# Patient Record
Sex: Female | Born: 1965 | Race: White | Hispanic: No | Marital: Married | Smoking: Former smoker
Health system: Southern US, Community
[De-identification: ages and names within clinical notes are randomized; demographics above are authoritative.]

## PROBLEM LIST (undated history)

## (undated) DIAGNOSIS — M5430 Sciatica, unspecified side: Secondary | ICD-10-CM

## (undated) DIAGNOSIS — K449 Diaphragmatic hernia without obstruction or gangrene: Secondary | ICD-10-CM

## (undated) DIAGNOSIS — E039 Hypothyroidism, unspecified: Secondary | ICD-10-CM

## (undated) DIAGNOSIS — K219 Gastro-esophageal reflux disease without esophagitis: Secondary | ICD-10-CM

## (undated) HISTORY — PX: CARPAL TUNNEL RELEASE: SHX101

---

## 1998-07-24 ENCOUNTER — Ambulatory Visit (HOSPITAL_COMMUNITY)
Admission: RE | Admit: 1998-07-24 | Discharge: 1998-07-24 | Payer: Self-pay | Admitting: Physical Medicine and Rehabilitation

## 1998-08-14 ENCOUNTER — Encounter: Payer: Self-pay | Admitting: Physical Medicine and Rehabilitation

## 1998-08-28 ENCOUNTER — Ambulatory Visit (HOSPITAL_COMMUNITY)
Admission: RE | Admit: 1998-08-28 | Discharge: 1998-08-28 | Payer: Self-pay | Admitting: Physical Medicine and Rehabilitation

## 1998-08-28 ENCOUNTER — Encounter: Payer: Self-pay | Admitting: Neurosurgery

## 2000-10-26 HISTORY — PX: TUBAL LIGATION: SHX77

## 2016-05-26 HISTORY — PX: LAPAROSCOPIC CHOLECYSTECTOMY: SUR755

## 2016-06-13 ENCOUNTER — Encounter (HOSPITAL_COMMUNITY)
Admission: AD | Disposition: A | Discharge status: Home / Self Care | Payer: Self-pay | Source: Other Acute Inpatient Hospital | Attending: Internal Medicine

## 2016-06-13 ENCOUNTER — Encounter (HOSPITAL_COMMUNITY): Payer: Self-pay | Admitting: Family Medicine

## 2016-06-13 ENCOUNTER — Inpatient Hospital Stay (HOSPITAL_COMMUNITY)
Admission: AD | Admit: 2016-06-13 | Discharge: 2016-06-17 | DRG: 394 | Disposition: A | Discharge status: Home / Self Care | Payer: BLUE CROSS/BLUE SHIELD | Source: Other Acute Inpatient Hospital | Attending: Internal Medicine | Admitting: Internal Medicine

## 2016-06-13 DIAGNOSIS — R109 Unspecified abdominal pain: Secondary | ICD-10-CM | POA: Diagnosis present

## 2016-06-13 DIAGNOSIS — R079 Chest pain, unspecified: Secondary | ICD-10-CM

## 2016-06-13 DIAGNOSIS — M5430 Sciatica, unspecified side: Secondary | ICD-10-CM | POA: Diagnosis present

## 2016-06-13 DIAGNOSIS — E860 Dehydration: Secondary | ICD-10-CM | POA: Diagnosis not present

## 2016-06-13 DIAGNOSIS — K219 Gastro-esophageal reflux disease without esophagitis: Secondary | ICD-10-CM | POA: Diagnosis present

## 2016-06-13 DIAGNOSIS — Z87891 Personal history of nicotine dependence: Secondary | ICD-10-CM

## 2016-06-13 DIAGNOSIS — E871 Hypo-osmolality and hyponatremia: Secondary | ICD-10-CM | POA: Diagnosis present

## 2016-06-13 DIAGNOSIS — K9189 Other postprocedural complications and disorders of digestive system: Secondary | ICD-10-CM | POA: Diagnosis present

## 2016-06-13 DIAGNOSIS — R197 Diarrhea, unspecified: Secondary | ICD-10-CM | POA: Diagnosis not present

## 2016-06-13 DIAGNOSIS — K929 Disease of digestive system, unspecified: Secondary | ICD-10-CM | POA: Diagnosis not present

## 2016-06-13 DIAGNOSIS — K449 Diaphragmatic hernia without obstruction or gangrene: Secondary | ICD-10-CM | POA: Diagnosis present

## 2016-06-13 DIAGNOSIS — Z791 Long term (current) use of non-steroidal anti-inflammatories (NSAID): Secondary | ICD-10-CM | POA: Diagnosis not present

## 2016-06-13 DIAGNOSIS — E039 Hypothyroidism, unspecified: Secondary | ICD-10-CM | POA: Diagnosis present

## 2016-06-13 DIAGNOSIS — Y836 Removal of other organ (partial) (total) as the cause of abnormal reaction of the patient, or of later complication, without mention of misadventure at the time of the procedure: Secondary | ICD-10-CM | POA: Diagnosis present

## 2016-06-13 DIAGNOSIS — D649 Anemia, unspecified: Secondary | ICD-10-CM | POA: Diagnosis present

## 2016-06-13 DIAGNOSIS — K1370 Unspecified lesions of oral mucosa: Secondary | ICD-10-CM | POA: Diagnosis not present

## 2016-06-13 DIAGNOSIS — Z6841 Body Mass Index (BMI) 40.0 and over, adult: Secondary | ICD-10-CM | POA: Diagnosis not present

## 2016-06-13 DIAGNOSIS — K838 Other specified diseases of biliary tract: Secondary | ICD-10-CM | POA: Diagnosis not present

## 2016-06-13 DIAGNOSIS — Z79899 Other long term (current) drug therapy: Secondary | ICD-10-CM | POA: Diagnosis not present

## 2016-06-13 HISTORY — DX: Gastro-esophageal reflux disease without esophagitis: K21.9

## 2016-06-13 HISTORY — DX: Diaphragmatic hernia without obstruction or gangrene: K44.9

## 2016-06-13 HISTORY — DX: Hypothyroidism, unspecified: E03.9

## 2016-06-13 HISTORY — DX: Sciatica, unspecified side: M54.30

## 2016-06-13 LAB — CBC WITH DIFFERENTIAL/PLATELET
BASOS ABS: 0 10*3/uL (ref 0.0–0.1)
Basophils Relative: 0 %
Eosinophils Absolute: 0.1 10*3/uL (ref 0.0–0.7)
Eosinophils Relative: 2 %
HEMATOCRIT: 35.5 % — AB (ref 36.0–46.0)
HEMOGLOBIN: 11.5 g/dL — AB (ref 12.0–15.0)
LYMPHS PCT: 8 %
Lymphs Abs: 0.6 10*3/uL — ABNORMAL LOW (ref 0.7–4.0)
MCH: 28.5 pg (ref 26.0–34.0)
MCHC: 32.4 g/dL (ref 30.0–36.0)
MCV: 88.1 fL (ref 78.0–100.0)
Monocytes Absolute: 0.8 10*3/uL (ref 0.1–1.0)
Monocytes Relative: 11 %
NEUTROS ABS: 6.1 10*3/uL (ref 1.7–7.7)
NEUTROS PCT: 79 %
Platelets: 210 10*3/uL (ref 150–400)
RBC: 4.03 MIL/uL (ref 3.87–5.11)
RDW: 13.9 % (ref 11.5–15.5)
WBC: 7.6 10*3/uL (ref 4.0–10.5)

## 2016-06-13 SURGERY — ERCP, WITH INTERVENTION IF INDICATED
Anesthesia: General

## 2016-06-13 MED ORDER — ENOXAPARIN SODIUM 40 MG/0.4ML ~~LOC~~ SOLN
40.0000 mg | Freq: Every day | SUBCUTANEOUS | Status: DC
  Administered 2016-06-15 – 2016-06-17 (×3): 40 mg via SUBCUTANEOUS
  Filled 2016-06-13 (×3): qty 0.4

## 2016-06-13 MED ORDER — PANTOPRAZOLE SODIUM 40 MG PO TBEC
40.0000 mg | DELAYED_RELEASE_TABLET | Freq: Every day | ORAL | Status: DC
Start: 2016-06-14 — End: 2016-06-17
  Administered 2016-06-14 – 2016-06-17 (×4): 40 mg via ORAL
  Filled 2016-06-13 (×4): qty 1

## 2016-06-13 MED ORDER — METHOCARBAMOL 750 MG PO TABS
750.0000 mg | ORAL_TABLET | Freq: Four times a day (QID) | ORAL | Status: DC
  Administered 2016-06-13 – 2016-06-17 (×13): 750 mg via ORAL
  Filled 2016-06-13 (×14): qty 1

## 2016-06-13 MED ORDER — ONDANSETRON HCL 4 MG/2ML IJ SOLN
4.0000 mg | Freq: Four times a day (QID) | INTRAMUSCULAR | Status: DC | PRN
  Administered 2016-06-14 (×2): 4 mg via INTRAVENOUS
  Filled 2016-06-13 (×2): qty 2

## 2016-06-13 MED ORDER — ACETAMINOPHEN 325 MG PO TABS
650.0000 mg | ORAL_TABLET | Freq: Four times a day (QID) | ORAL | Status: DC | PRN
  Administered 2016-06-16 (×2): 650 mg via ORAL
  Filled 2016-06-13 (×2): qty 2

## 2016-06-13 MED ORDER — ACETAMINOPHEN 650 MG RE SUPP: 650.0000 mg | Freq: Four times a day (QID) | RECTAL | Status: DC | PRN

## 2016-06-13 MED ORDER — SENNOSIDES-DOCUSATE SODIUM 8.6-50 MG PO TABS
2.0000 | ORAL_TABLET | Freq: Every day | ORAL | Status: DC
  Administered 2016-06-15: 2 via ORAL
  Filled 2016-06-13: qty 2

## 2016-06-13 MED ORDER — METHOCARBAMOL 500 MG PO TABS: 1000.0000 mg | ORAL_TABLET | Freq: Three times a day (TID) | ORAL | Status: DC | PRN

## 2016-06-13 MED ORDER — OXYCODONE-ACETAMINOPHEN 5-325 MG PO TABS: 1.0000 | ORAL_TABLET | ORAL | Status: DC | PRN

## 2016-06-13 MED ORDER — LEVOTHYROXINE SODIUM 25 MCG PO TABS
125.0000 ug | ORAL_TABLET | Freq: Every day | ORAL | Status: DC
  Administered 2016-06-14 – 2016-06-17 (×4): 125 ug via ORAL
  Filled 2016-06-13 (×4): qty 1

## 2016-06-13 MED ORDER — NAPROXEN SODIUM 550 MG PO TABS
550.0000 mg | ORAL_TABLET | Freq: Every day | ORAL | Status: DC
  Filled 2016-06-13: qty 1

## 2016-06-13 MED ORDER — OXYCODONE-ACETAMINOPHEN 5-325 MG PO TABS
1.5000 | ORAL_TABLET | ORAL | Status: DC | PRN
  Administered 2016-06-14 – 2016-06-15 (×3): 1.5 via ORAL
  Filled 2016-06-13 (×3): qty 2

## 2016-06-13 MED ORDER — ONDANSETRON HCL 4 MG PO TABS: 4.0000 mg | ORAL_TABLET | Freq: Four times a day (QID) | ORAL | Status: DC | PRN

## 2016-06-13 MED ORDER — SENNOSIDES-DOCUSATE SODIUM 8.6-50 MG PO TABS: 1.0000 | ORAL_TABLET | Freq: Every day | ORAL | Status: DC

## 2016-06-13 NOTE — H&P (Signed)
History and Physical  Patient Name: Kristin SievertJill E Farnell     WUJ:[id number]    DOB: 06/01/1966    DOA: 06/13/2016 PCP: Virl DiamondPALLONE, AMANDA W, MD   Patient coming from: Home --> [facility] transfer  Chief Complaint: Abdominal and chest pain  HPI: Kristin Doyle is a 50 y.o. female with a past medical history significant for obesity and hypothroidism who presents with chest pain.  The patient was in her usual state of health until a few weeks ago when she had onset of chest pain radiating to the shoulder blades and was admitted overnight at [facility] for cardiac rule out, had serial negative ezymes and EKG, as well as CT of the abdomen and pelvis with contrast, and HIDA scan all of which were normal (RUQ US had only positive Murphy's sign).  Her PCP was concerned in follow up that she might have worsening of her hiatal hernia and referred her to General Surgery.  Then 10 days ago she had her gallbladder removed [facility] on a Thursday, had improvement of her symtoms and was back to work on Monday.    However, Wednesday night of this week after work she had again onset of dull epigastric discomfort that developed into "10/10" pain all over the chest, shoulders and radiating to the right shoulder (she thought she was having a heart attack).  [facility], had serially negative enzymes, normal EKG.  A CT angiogram of the chest, abdomen and pelvis was obtained that showed no PE and only normal post-operative changes and General Surgery at [facility] doubted post-operative complications given her initial improvement after surgery.  This morning, she underwent a HIDA scan that showed ectopic tracer in the abdomen, interpreted as probable post-operative bile leak.  This was discussed with her cholecystectomy surgeon and then Dr. Elnoria HowardHung from [facility] who recommended [facility] and ERCP.     Review of Systems:  Review of Systems  Constitutional:  Negative for chills and fever.  Gastrointestinal: Positive for abdominal pain, constipation and nausea (with eating). Negative for diarrhea and vomiting.       Pleuritic pain  All other systems reviewed and are negative.    Past Medical History:  Diagnosis Date  . GERD (gastroesophageal reflux disease)   . Hiatal hernia   . Hypothyroidism   . Sciatica     Past Surgical History:  Procedure Laterality Date  . CARPAL TUNNEL RELEASE    . CHOLECYSTECTOMY  05/2016  . TUBAL LIGATION      Social History: Patient lives with her husband and two teenage children.  Patient walks unassisted.  She is a Engineer, siteschool teacher.  She does not drink or smoke.  [place]  Allergies  Allergen Reactions  . Ciprofloxacin Swelling    Family history: family history includes Bladder Cancer in her mother; Heart disease in her maternal grandmother, maternal uncle, and paternal grandmother.  Prior to Admission medications   Medication Sig Start Date End Date Taking? Authorizing Provider  levothyroxine (SYNTHROID, LEVOTHROID) 125 MCG tablet Take 125 mcg by mouth daily before breakfast.   Yes Historical Provider, MD  naproxen sodium (ANAPROX) 550 MG tablet Take 550 mg by mouth daily.   Yes Historical Provider, MD  omeprazole (PRILOSEC) 20 MG capsule Take 20 mg by mouth daily.   Yes Historical Provider, MD       Physical Exam: BP (!) 147/79 (BP Location: Left Arm)   Pulse 94  Temp 98.4 F (36.9 C) (Oral)   Resp 17   Ht 5\' 7"  (1.702 m)   Wt 119.3 kg (263 lb 0.1 oz)   SpO2 97%   BMI 41.19 kg/m  General appearance: Well-developed, obese adult female, alert and in no acute distress, appears mildly uncomfortable.   Eyes: Anicteric, conjunctiva pink, lids and lashes normal.     ENT: No nasal deformity, discharge, or epistaxis.  OP moist without lesions.   Skin: Warm and dry.  No jaundice.  No suspicious rashes or lesions.  Chole scars healing well. Cardiac: RRR, nl S1-S2, no murmurs  appreciated.  Capillary refill is brisk.  JVP not visible.  No LE edema.  Radial and DP pulses 2+ and symmetric. Respiratory: Normal respiratory rate and rhythm.  CTAB without rales or wheezes. Diminished at bilateral bases. Abdomen: Abdomen soft without rigidity.  Mild RUQ TTP without guarding. No ascites, distension appreciated.   MSK: No deformities or effusions. Neuro: Cranial nerves normal. Sensorium intact and responding to questions, attention normal.  Speech is fluent.  Moves all extremities equally and with normal coordination.    Psych: Behavior appropriate.  Affect noraml.  No evidence of aural or visual hallucinations or delusions.       Labs on Admission:  Her initial work up at OSH from two days ago was reviewed: The metabolic panel showed normal electrolytes and renal function, slightly elevated AG. The complete blood count showed no leukocytosis, anemia or thrombocytopenia. Troponins, negative x2.     Radiological Exams on Admission: Personally reviewed reports from imaging from OSH: CTA of chest abdomen and pelvis from two days ago showed no PE or pneumonia.  Abdomen showed only normal post-operative chages from cholecystectomy.  CXR from two days ago showed bilateral atelectasis only.  HIDA scan from today showed no tracer pick up at location of gallbladder but some ectopic abdominal radiotracer consistent with post-operative bile leak.          Assessment/Plan 1. Bile leak:  -Consult to GI -NPO for ERCP tomorrow, intend to place stent that will allow drainage of bile duct stump -Percocet for pain -Robaxin was able to help pain, patient feels, so this can be continued -Ondansetron for nausea -Will check CMP and CBC now    2. Hypothyrodism:  -Continue levothyroxine  3. Hiatal herani:  -Continue PPI  4. Sciatica:  -Continue naproxen    DVT prophylaxis: Lovenox  Code Status: FULL  Family Communication: Husband and parents at bedside.  An  opportunity for questions was given and all questions were ansered.  Disposition Plan: Anticipate ERCP and stent tomorrow, likely d/c by Monday. Consults called: GI, Dr. Elnoria HowardHung Admission status: INPATIENT, med surg   Medical decision making: Patient seen at 10:00 PM on 06/13/2016.  The patient was discussed with Dr. Elnoria HowardHung.  What exists of the patient's chart and outside [facility] was reviewed in depth.  Clinical condition: stable.          Alberteen SamChristopher P Marisa Hufstetler [facility] Pager [phone]

## 2016-06-13 NOTE — Consult Note (Signed)
Reason for Consult: Bile duct leak Referring Physician: [facility]  Wing E Potier HPI: [name].  The surgery was performed in [place] and no immediate complications occurred.  She went back to work the following Monday and felt well until Wednesday.  She had a severe sensation to pass a bowel movement, but when she did have the bowel movement all of her symptoms markedly worsened.  There was a chest pain sensation, right shoulder pain, and she felt as if her pelvic floor was going to drop out.  Subsequently she was admitted to [facility] as it was closer to her home.  Further studies revealed a bile duct leak.  There was no ERCP coverage at [facility] and she was subsequently transferred here for further evaluation and treatment.  No past medical history on file.  No past surgical history on file.  No family history on file.  Social History:  has no tobacco, alcohol, and drug history on file.  Allergies: Allergies not on file  Medications: Scheduled: Continuous:  No results found for this or any previous visit (from the past 24 hour(s)).   No results found.  ROS:  As stated above in the HPI otherwise negative.  Blood pressure (!) 147/79, pulse 94, temperature 98.4 F (36.9 C), temperature source Oral, resp. rate 17, height 5' 7" (1.702 m), weight 119.3 kg (263 lb 0.1 oz), SpO2 97 %.    PE: Gen: NAD, Alert and Oriented HEENT:  /AT, EOMI Neck: Supple, no LAD Lungs: CTA Bilaterally CV: RRR without M/G/R ABM: Soft, obsese, diffuse tenderness, +BS Ext: No C/C/E  Assessment/Plan: 1) Bile duct leak. 2) S/p Lap Chole. 3) ABM pain.   She requires an ERCP.  I described the procedure and the risks of bleeding, infection, perforation, and pancreatitis with her and her family.  She understands the risks and benefits and wishes to proceed.  Plan: 1) ERCP tomorrow with stent  placement.  Kristin Doyle D 06/13/2016, 10:19 PM     

## 2016-06-14 ENCOUNTER — Inpatient Hospital Stay (HOSPITAL_COMMUNITY): Payer: BLUE CROSS/BLUE SHIELD | Admitting: Anesthesiology

## 2016-06-14 ENCOUNTER — Encounter (HOSPITAL_COMMUNITY)
Admission: AD | Disposition: A | Discharge status: Home / Self Care | Payer: Self-pay | Source: Other Acute Inpatient Hospital | Attending: Internal Medicine

## 2016-06-14 ENCOUNTER — Inpatient Hospital Stay (HOSPITAL_COMMUNITY): Payer: BLUE CROSS/BLUE SHIELD

## 2016-06-14 ENCOUNTER — Encounter (HOSPITAL_COMMUNITY): Payer: Self-pay

## 2016-06-14 DIAGNOSIS — E039 Hypothyroidism, unspecified: Secondary | ICD-10-CM

## 2016-06-14 HISTORY — PX: ERCP: SHX5425

## 2016-06-14 LAB — COMPREHENSIVE METABOLIC PANEL
ALT: 28 U/L (ref 14–54)
ALT: 27 U/L (ref 14–54)
ANION GAP: 8 (ref 5–15)
AST: 27 U/L (ref 15–41)
AST: 29 U/L (ref 15–41)
Albumin: 2.6 g/dL — ABNORMAL LOW (ref 3.5–5.0)
Albumin: 2.8 g/dL — ABNORMAL LOW (ref 3.5–5.0)
Alkaline Phosphatase: 110 U/L (ref 38–126)
Alkaline Phosphatase: 120 U/L (ref 38–126)
Anion gap: 7 (ref 5–15)
BILIRUBIN TOTAL: 1.2 mg/dL (ref 0.3–1.2)
BUN: 16 mg/dL (ref 6–20)
BUN: 17 mg/dL (ref 6–20)
CHLORIDE: 100 mmol/L — AB (ref 101–111)
CO2: 23 mmol/L (ref 22–32)
CO2: 27 mmol/L (ref 22–32)
CREATININE: 0.69 mg/dL (ref 0.44–1.00)
Calcium: 7.8 mg/dL — ABNORMAL LOW (ref 8.9–10.3)
Calcium: 8.6 mg/dL — ABNORMAL LOW (ref 8.9–10.3)
Chloride: 100 mmol/L — ABNORMAL LOW (ref 101–111)
Creatinine, Ser: 0.71 mg/dL (ref 0.44–1.00)
GFR calc Af Amer: >60 mL/min (ref >60)
GFR calc non Af Amer: >60 mL/min (ref >60)
Glucose, Bld: 130 mg/dL — ABNORMAL HIGH (ref 65–99)
Glucose, Bld: 94 mg/dL (ref 65–99)
POTASSIUM: 4.1 mmol/L (ref 3.5–5.1)
Potassium: 3.8 mmol/L (ref 3.5–5.1)
SODIUM: 134 mmol/L — AB (ref 135–145)
Sodium: 131 mmol/L — ABNORMAL LOW (ref 135–145)
TOTAL PROTEIN: 6.6 g/dL (ref 6.5–8.1)
Total Bilirubin: 1.8 mg/dL — ABNORMAL HIGH (ref 0.3–1.2)
Total Protein: 6.5 g/dL (ref 6.5–8.1)

## 2016-06-14 LAB — PROTIME-INR
INR: 1.05
Prothrombin Time: 13.7 seconds (ref 11.4–15.2)

## 2016-06-14 SURGERY — ERCP, WITH INTERVENTION IF INDICATED
Anesthesia: General

## 2016-06-14 MED ORDER — FENTANYL CITRATE (PF) 100 MCG/2ML IJ SOLN
INTRAMUSCULAR | Status: AC
  Filled 2016-06-14: qty 2

## 2016-06-14 MED ORDER — CEFAZOLIN SODIUM-DEXTROSE 2-4 GM/100ML-% IV SOLN
INTRAVENOUS | Status: AC
  Filled 2016-06-14: qty 100

## 2016-06-14 MED ORDER — MORPHINE SULFATE (PF) 4 MG/ML IV SOLN
4.0000 mg | Freq: Once | INTRAVENOUS | Status: AC
  Administered 2016-06-14: 4 mg via INTRAVENOUS
  Filled 2016-06-14: qty 1

## 2016-06-14 MED ORDER — HYDROMORPHONE HCL 1 MG/ML IJ SOLN
INTRAMUSCULAR | Status: AC
  Filled 2016-06-14: qty 1

## 2016-06-14 MED ORDER — PROPOFOL 10 MG/ML IV BOLUS
INTRAVENOUS | Status: DC | PRN
  Administered 2016-06-14: 30 mg via INTRAVENOUS
  Administered 2016-06-14: 150 mg via INTRAVENOUS

## 2016-06-14 MED ORDER — IOPAMIDOL (ISOVUE-300) INJECTION 61%
INTRAVENOUS | Status: AC
  Filled 2016-06-14: qty 50

## 2016-06-14 MED ORDER — KETOROLAC TROMETHAMINE 30 MG/ML IJ SOLN
30.0000 mg | Freq: Once | INTRAMUSCULAR | Status: AC
  Administered 2016-06-14: 30 mg via INTRAVENOUS

## 2016-06-14 MED ORDER — LACTATED RINGERS IV SOLN
INTRAVENOUS | Status: DC | PRN
  Administered 2016-06-14: 16:00:00 via INTRAVENOUS

## 2016-06-14 MED ORDER — SUCCINYLCHOLINE CHLORIDE 20 MG/ML IJ SOLN
INTRAMUSCULAR | Status: DC | PRN
  Administered 2016-06-14: 100 mg via INTRAVENOUS

## 2016-06-14 MED ORDER — HYDROMORPHONE HCL 1 MG/ML IJ SOLN
0.5000 mg | INTRAMUSCULAR | Status: DC | PRN
  Administered 2016-06-14 (×2): 0.5 mg via INTRAVENOUS

## 2016-06-14 MED ORDER — GLUCAGON HCL RDNA (DIAGNOSTIC) 1 MG IJ SOLR
INTRAMUSCULAR | Status: AC
  Filled 2016-06-14: qty 1

## 2016-06-14 MED ORDER — CEFAZOLIN SODIUM-DEXTROSE 2-3 GM-% IV SOLR
INTRAVENOUS | Status: DC | PRN
  Administered 2016-06-14: 2 g via INTRAVENOUS

## 2016-06-14 MED ORDER — MIDAZOLAM HCL 5 MG/5ML IJ SOLN
INTRAMUSCULAR | Status: DC | PRN
  Administered 2016-06-14: 2 mg via INTRAVENOUS

## 2016-06-14 MED ORDER — NAPROXEN 250 MG PO TABS: 500.0000 mg | ORAL_TABLET | Freq: Every day | ORAL | Status: DC

## 2016-06-14 MED ORDER — MORPHINE SULFATE (PF) 2 MG/ML IV SOLN
1.0000 mg | INTRAVENOUS | Status: DC | PRN
  Administered 2016-06-14 (×4): 1 mg via INTRAVENOUS
  Filled 2016-06-14 (×3): qty 1

## 2016-06-14 MED ORDER — FENTANYL CITRATE (PF) 100 MCG/2ML IJ SOLN
25.0000 ug | INTRAMUSCULAR | Status: DC | PRN
  Administered 2016-06-14: 50 ug via INTRAVENOUS
  Administered 2016-06-14: 25 ug via INTRAVENOUS

## 2016-06-14 MED ORDER — KETOROLAC TROMETHAMINE 30 MG/ML IJ SOLN
INTRAMUSCULAR | Status: AC
  Filled 2016-06-14: qty 1

## 2016-06-14 MED ORDER — NITROGLYCERIN 0.4 MG SL SUBL
SUBLINGUAL_TABLET | SUBLINGUAL | Status: AC
  Filled 2016-06-14: qty 1

## 2016-06-14 MED ORDER — PROMETHAZINE HCL 25 MG/ML IJ SOLN
12.5000 mg | Freq: Once | INTRAMUSCULAR | Status: AC
  Administered 2016-06-14: 12.5 mg via INTRAVENOUS
  Filled 2016-06-14: qty 1

## 2016-06-14 MED ORDER — HYDROMORPHONE HCL 1 MG/ML IJ SOLN
1.0000 mg | Freq: Once | INTRAMUSCULAR | Status: AC
  Administered 2016-06-14: 1 mg via INTRAVENOUS
  Filled 2016-06-14: qty 1

## 2016-06-14 MED ORDER — NITROGLYCERIN 0.4 MG SL SUBL
0.4000 mg | SUBLINGUAL_TABLET | SUBLINGUAL | Status: DC | PRN
  Administered 2016-06-14: 0.4 mg via SUBLINGUAL

## 2016-06-14 MED ORDER — MORPHINE SULFATE (PF) 2 MG/ML IV SOLN
2.0000 mg | Freq: Once | INTRAVENOUS | Status: AC
  Administered 2016-06-14: 2 mg via INTRAVENOUS
  Filled 2016-06-14: qty 1

## 2016-06-14 MED ORDER — FENTANYL CITRATE (PF) 250 MCG/5ML IJ SOLN
INTRAMUSCULAR | Status: DC | PRN
  Administered 2016-06-14 (×2): 50 ug via INTRAVENOUS

## 2016-06-14 MED ORDER — LIDOCAINE HCL (CARDIAC) 20 MG/ML IV SOLN
INTRAVENOUS | Status: DC | PRN
  Administered 2016-06-14: 100 mg via INTRATRACHEAL

## 2016-06-14 MED ORDER — PROMETHAZINE HCL 25 MG/ML IJ SOLN: 6.2500 mg | INTRAMUSCULAR | Status: DC | PRN

## 2016-06-14 MED ORDER — CEFAZOLIN SODIUM-DEXTROSE 2-4 GM/100ML-% IV SOLN: 2.0000 g | Freq: Once | INTRAVENOUS | Status: DC

## 2016-06-14 MED ORDER — SODIUM CHLORIDE 0.9 % IV SOLN
INTRAVENOUS | Status: AC
  Administered 2016-06-15: 05:00:00 via INTRAVENOUS

## 2016-06-14 NOTE — Transfer of Care (Signed)
Immediate Anesthesia Transfer of Care Note  Patient: Kristin SievertJill E Doyle  Procedure(s) Performed: Procedure(s): ENDOSCOPIC RETROGRADE CHOLANGIOPANCREATOGRAPHY (ERCP) (N/A)  Patient Location: PACU  Anesthesia Type:General  Level of Consciousness: sedated  Airway & Oxygen Therapy: Patient Spontanous Breathing and Patient connected to face mask oxygen  Post-op Assessment: Report given to RN and Post -op Vital signs reviewed and stable  Post vital signs: Reviewed and stable  Last Vitals:  Vitals:   06/14/16 1440 06/14/16 1740  BP: (!) 141/78 (P) 128/88  Pulse: 96 (!) (P) 109  Resp: 16 (P) 20  Temp:  (P) 36.9 C    Last Pain:  Vitals:   06/14/16 1357  TempSrc: Oral  PainSc:       Patients Stated Pain Goal: 2 (06/13/16 2340)  Complications: No apparent anesthesia complications

## 2016-06-14 NOTE — Significant Event (Signed)
Rapid Response Event Note RN called for a second opinion, pt reporting SOB/CP Overview: Time Called: 2043 Arrival Time: 2045 Event Type: Respiratory, Cardiac  Initial Focused Assessment: Pt appeared extremely uncomfortable in the bed, body was rigid, c/o SOB and initially sharp stabbing pain to Left chest when taking in a deep breath. Pt then reported pain was a constant 9/10 pain. Pt received Morphine 1 mg and Zofran 4 mg IVP PTA with no relief, pt's O2 sats 96% RA RN placed on 2L [place] for comfort, sats increased to 100%. Lungs clear through out, no respiratory distress noted, pt grunting and belching with each breath. BP 117/80, HR 100, RR 20, 100% 2L [place]   Interventions: Portable CXR. EKG showing ST nonspecific T wave abnormality, Nitro 0.4 SL x1 with no relief, Phenergan 12.5 mg IVP given.  Plan of Care (if not transferred): Per Dr. Bretta BangEasterwood give pt phenergan 12.5 mg IVP if VS remain stable give an additional Morphine 2 mg IVP. Dr will reevalaute frequency and dose of pain management as pt was requesting additional pain medication this am prior to Endo procedure. She feels patient may need something for breakthrough pain. [facility] BP 111/67, HR 104, RR 20, 98% 2L [place] will administer Morphine 2 mg IVP, repeat set of VS s/p Morphine BP 120/65, HR 103, RR 18, 98% 2L . Pt resting, when aroused reports pain 9/10.  Event Summary: Name of Physician Notified: Easterwood at 2050    at    Outcome: Stayed in room and stabalized     AlexisSHULAR, Rosangela Fehrenbach [place] Village[place]ige

## 2016-06-14 NOTE — Interval H&P Note (Signed)
History and Physical Interval Note:  06/14/2016 4:48 PM  Kristin SievertJill E Doyle  has presented today for surgery, with the diagnosis of endo  The various methods of treatment have been discussed with the patient and family. After consideration of risks, benefits and other options for treatment, the patient has consented to  Procedure(s): ENDOSCOPIC RETROGRADE CHOLANGIOPANCREATOGRAPHY (ERCP) (N/A) as a surgical intervention .  The patient's history has been reviewed, patient examined, no change in status, stable for surgery.  I have reviewed the patient's chart and labs.  Questions were answered to the patient's satisfaction.     Akeria Hedstrom D

## 2016-06-14 NOTE — H&P (View-Only) (Signed)
Reason for Consult: Bile duct leak Referring Physician: [facility]  Donell SievertJill E Stinnette HPI: [name].  [place] and no immediate complications occurred.  She went back to work the following Monday and felt well until Wednesday.  She had a severe sensation to pass a bowel movement, but when she did have the bowel movement all of her symptoms markedly worsened.  There was a chest pain sensation, right shoulder pain, and she felt as if her pelvic floor was going to drop out.  Subsequently she was admitted [facility] as it was closer to her home.  Further studies revealed a bile duct leak.  [facility] and she was subsequently transferred here for further evaluation and treatment.  No past medical history on file.  No past surgical history on file.  No family history on file.  Social History:  has no tobacco, alcohol, and drug history on file.  Allergies: Allergies not on file  Medications: Scheduled: Continuous:  No results found for this or any previous visit (from the past 24 hour(s)).   No results found.  ROS:  As stated above in the HPI otherwise negative.  Blood pressure (!) 147/79, pulse 94, temperature 98.4 F (36.9 C), temperature source Oral, resp. rate 17, height 5\' 7"  (1.702 m), weight 119.3 kg (263 lb 0.1 oz), SpO2 97 %.    PE: Gen: NAD, Alert and Oriented HEENT:  [place]/AT, EOMI Neck: Supple, no LAD Lungs: CTA Bilaterally CV: RRR without M/G/R ABM: Soft, obsese, diffuse tenderness, +BS Ext: No C/C/E  Assessment/Plan: 1) Bile duct leak. 2) S/p Lap Chole. 3) ABM pain.   She requires an ERCP.  I described the procedure and the risks of bleeding, infection, perforation, and pancreatitis with her and her family.  She understands the risks and benefits and wishes to proceed.  Plan: 1) ERCP tomorrow with stent  placement.  Liddie Chichester D 06/13/2016, 10:19 PM

## 2016-06-14 NOTE — Anesthesia Postprocedure Evaluation (Signed)
Anesthesia Post Note  Patient: Kristin SievertJill E Doyle  Procedure(s) Performed: Procedure(s) (LRB): ENDOSCOPIC RETROGRADE CHOLANGIOPANCREATOGRAPHY (ERCP) (N/A)  Patient location during evaluation: PACU Anesthesia Type: General Level of consciousness: awake and alert Pain management: pain level controlled Vital Signs Assessment: post-procedure vital signs reviewed and stable Respiratory status: spontaneous breathing, nonlabored ventilation, respiratory function stable and patient connected to nasal cannula oxygen Cardiovascular status: blood pressure returned to baseline and stable Postop Assessment: no signs of nausea or vomiting Anesthetic complications: no    Last Vitals:  Vitals:   06/14/16 1900 06/14/16 1919  BP: 139/75 139/72  Pulse: 100 96  Resp: 18 20  Temp: 36.8 C 37.3 C    Last Pain:  Vitals:   06/14/16 1919  TempSrc: Oral  PainSc:                  [name], [name]

## 2016-06-14 NOTE — Anesthesia Preprocedure Evaluation (Addendum)
Anesthesia Evaluation  Patient identified by MRN, date of birth, ID band Patient awake    Reviewed: Allergy & Precautions, NPO status , Patient's Chart, lab work & pertinent test results  Airway Mallampati: II  TM Distance: >3 FB Neck ROM: Full    Dental  (+) Dental Advisory Given   Pulmonary former smoker,    breath sounds clear to auscultation       Cardiovascular negative cardio ROS   Rhythm:Regular Rate:Normal     Neuro/Psych negative neurological ROS     GI/Hepatic Neg liver ROS, hiatal hernia, GERD  ,  Endo/Other  Hypothyroidism Morbid obesity  Renal/GU negative Renal ROS     Musculoskeletal   Abdominal   Peds  Hematology  (+) anemia ,   Anesthesia Other Findings   Reproductive/Obstetrics                            Lab Results  Component Value Date   WBC 7.6 06/13/2016   HGB 11.5 (L) 06/13/2016   HCT 35.5 (L) 06/13/2016   MCV 88.1 06/13/2016   PLT 210 06/13/2016   Lab Results  Component Value Date   CREATININE 0.69 06/13/2016   BUN 16 06/13/2016   NA 134 (L) 06/13/2016   K 3.8 06/13/2016   CL 100 (L) 06/13/2016   CO2 27 06/13/2016    Anesthesia Physical Anesthesia Plan  ASA: III  Anesthesia Plan: General   Post-op Pain Management:    Induction: Intravenous  Airway Management Planned: Oral ETT  Additional Equipment:   Intra-op Plan:   Post-operative Plan: Extubation in OR  Informed Consent: I have reviewed the patients History and Physical, chart, labs and discussed the procedure including the risks, benefits and alternatives for the proposed anesthesia with the patient or authorized representative who has indicated his/her understanding and acceptance.   Dental advisory given  Plan Discussed with: CRNA, Anesthesiologist and Surgeon  Anesthesia Plan Comments:        Anesthesia Quick Evaluation

## 2016-06-14 NOTE — Op Note (Signed)
[facility] Patient Name: Kristin HoughJill Seeman Procedure Date : 06/14/2016 MRN: [id number] Attending MD: Jeani HawkingPatrick Marquee Fuchs , MD Date of Birth: 01/29/1966 CSN: [id number] Age: [age] Admit Type: Outpatient Procedure:                ERCP Indications:              Bile leak Providers:                Jeani HawkingPatrick Daeton Kluth, MD, Anthony Saraniel Madden, RN, Beryle BeamsJanie Billups,                            Technician Referring MD:              Medicines:                General Anesthesia Complications:            No immediate complications. Estimated Blood Loss:     Estimated blood loss: none. Procedure:                Pre-Anesthesia Assessment:                           - Prior to the procedure, a History and Physical                            was performed, and patient medications and                            allergies were reviewed. The patient's tolerance of                            previous anesthesia was also reviewed. The risks                            and benefits of the procedure and the sedation                            options and risks were discussed with the patient.                            All questions were answered, and informed consent                            was obtained. Prior Anticoagulants: The patient has                            taken no previous anticoagulant or antiplatelet                            agents. ASA Grade Assessment: II - A patient with                            mild systemic disease. After reviewing the risks                            and benefits, the  patient was deemed in                            satisfactory condition to undergo the procedure.                           - Sedation was administered by an anesthesia                            professional. General anesthesia was attained.                           After obtaining informed consent, the scope was                            passed under direct vision. Throughout the   procedure, the patient's blood pressure, pulse, and                            oxygen saturations were monitored continuously. The                            [id number]) scope was introduced through                            the mouth, and used to inject contrast into and                            used to cannulate the bile duct. The ERCP was                            accomplished without difficulty. The patient                            tolerated the procedure well. Scope In: Scope Out: Findings:      One 10 Fr by 5 cm plastic stent with a single external flap and a single       internal flap was placed 4 cm into the common bile duct. Fluid flowed       through the stent. The stent was in good position. Extravasation of       contrast originating from the cystic duct was observed.      There was extravasation of contast, but it was difficult to see if the       originwas from the cystic duct stump versus the gallbladder fossa. The       proximal portion of the stent was located just distal to the surgical       clips by fluoroscopy. Impression:               - A bile leak was found. This was treated with                            stent placement.                           - One plastic stent  was placed into the common bile                            duct. Recommendation:           - Return patient to hospital ward for ongoing care.                           - Resume regular diet.                           - Return to GI clinic in 4 weeks. Procedure Code(s):        --- Professional ---                           [phone], Endoscopic retrograde                            cholangiopancreatography (ERCP); with placement of                            endoscopic stent into biliary or pancreatic duct,                            including pre- and post-dilation and guide wire                            passage, when performed, including sphincterotomy,                            when  performed, each stent Diagnosis Code(s):        --- Professional ---                           K83.9, Disease of biliary tract, unspecified                           K83.8, Other specified diseases of biliary tract CPT copyright 2016 American Medical Association. All rights reserved. The codes documented in this report are preliminary and upon coder review may  be revised to meet current compliance requirements. Jeani Hawking, MD Jeani Hawking, MD 06/14/2016 5:28:55 PM This report has been signed electronically. Number of Addenda: 0

## 2016-06-14 NOTE — Progress Notes (Signed)
PROGRESS NOTE    Kristin SievertJill E Doyle  ZOX:[id number] DOB: 07/08/1966 DOA: 06/13/2016 PCP: Virl DiamondPALLONE, AMANDA W, MD    Brief Narrative:  50yo [facility]  However, on Wednesday, developed epigastric discomfort radiating to shoulder.  HIDA on the morning of admission showed tracer in the abdomen which was interpreted as a post-op bile leak.  She was admitted for ERCP for correction.    Assessment & Plan:   Bile leak, postoperative - GI following - ERCP today - NPO - Pain control with percocet and morphine - Nausea control with zofran  Hypothyroidism, acquired - Continue synthroid  GERD/hiatal hernia - continue pantoprazole  Sciatica - Continue robaxin  Anemia, normocytic - Mild, possibly post op - rechecking post ERCP   DVT prophylaxis: SCDs, can do lovenox after procedure if stays in hospital Code Status: Full Family Communication: husband at bedside Disposition Plan: If doing well post procedure, possible discharge Monday   Consultants:   GI  Procedures:   ERCP 8/20  Antimicrobials:   Ancef prior to procedure    Subjective: Patient with continues "spasm" pain in the RUQ which radiates to shoulder, requesting stronger medication.   Objective: Vitals:   06/13/16 2156 06/14/16 0547  BP: (!) 147/79 120/78  Pulse: 94 (!) 102  Resp: 17 17  Temp: 98.4 F (36.9 C) 98.4 F (36.9 C)  TempSrc: Oral Oral  SpO2: 97% 93%  Weight: 263 lb 0.1 oz (119.3 kg)   Height: 5\' 7"  (1.702 m)     Intake/Output Summary (Last 24 hours) at 06/14/16 0741 Last data filed at 06/14/16 0548  Gross per 24 hour  Intake                0 ml  Output              700 ml  Net             -700 ml   Filed Weights   06/13/16 2156  Weight: 263 lb 0.1 oz (119.3 kg)    Examination:  General exam: Appears uncomfortable, but calm Eyes: Anicteric sclerae, no conjunctival pallor Respiratory system: Clear to auscultation. Respiratory effort  normal. Cardiovascular system: S1 & S2 heard, RR, NR. No murmurs, rubs, gallops or clicks. No pedal edema. Gastrointestinal system: Abdomen is obese, soft and tender in the RUQ and epigastrium.  Well healing cholecystectomy scars. Normal bowel sounds heard. Central nervous system: Alert and oriented. No gross neurological deficits. Skin: No rashes, lesions or ulcers Psychiatry: Judgement and insight appear normal. Mood & affect appropriate.     Data Reviewed:  CBC:  Recent Labs Lab 06/13/16 2330  WBC 7.6  NEUTROABS 6.1  HGB 11.5*  HCT 35.5*  MCV 88.1  PLT 210   Basic Metabolic Panel:  Recent Labs Lab 06/13/16 2330  NA 134*  K 3.8  CL 100*  CO2 27  GLUCOSE 130*  BUN 16  CREATININE 0.69  CALCIUM 8.6*   GFR: Estimated Creatinine Clearance: 115 mL/min (by C-G formula based on SCr of 0.8 mg/dL). Liver Function Tests:  Recent Labs Lab 06/13/16 2330  AST 27  ALT 27  ALKPHOS 110  BILITOT 1.8*  PROT 6.5  ALBUMIN 2.8*   No results for input(s): LIPASE, AMYLASE in the last 168 hours. No results for input(s): AMMONIA in the last 168 hours. Coagulation Profile:  Recent Labs Lab 06/13/16 2330  INR 1.05   Cardiac Enzymes: No results for input(s): CKTOTAL, CKMB, CKMBINDEX, TROPONINI  in the last 168 hours. BNP (last 3 results) No results for input(s): PROBNP in the last 8760 hours. HbA1C: No results for input(s): HGBA1C in the last 72 hours. CBG: No results for input(s): GLUCAP in the last 168 hours. Lipid Profile: No results for input(s): CHOL, HDL, LDLCALC, TRIG, CHOLHDL, LDLDIRECT in the last 72 hours. Thyroid Function Tests: No results for input(s): TSH, T4TOTAL, FREET4, T3FREE, THYROIDAB in the last 72 hours. Anemia Panel: No results for input(s): VITAMINB12, FOLATE, FERRITIN, TIBC, IRON, RETICCTPCT in the last 72 hours. Sepsis Labs: No results for input(s): PROCALCITON, LATICACIDVEN in the last 168 hours.  No results found for this or any previous  visit (from the past 240 hour(s)).       Radiology Studies: No results found.      Scheduled Meds: . enoxaparin (LOVENOX) injection  40 mg Subcutaneous Daily  . levothyroxine  125 mcg Oral QAC breakfast  . methocarbamol  750 mg Oral Q6H  . naproxen  500 mg Oral Q breakfast  . pantoprazole  40 mg Oral Daily  . senna-docusate  2 tablet Oral Daily   Continuous Infusions:    LOS: 1 day    Time spent: 25 minutes    Debe CoderMULLEN, Ephram Kornegay, MD [facility] Pager [phone]  If 7PM-7AM, please contact night-coverage [email or web address] Password [email or web address] 06/14/2016, 7:41 AM

## 2016-06-14 NOTE — Anesthesia Procedure Notes (Signed)
Procedure Name: Intubation Date/Time: 06/14/2016 5:02 PM Performed by: Brien MatesMAHONY, Gennifer Potenza D Pre-anesthesia Checklist: Patient identified, Emergency Drugs available, Suction available, Patient being monitored and Timeout performed Patient Re-evaluated:Patient Re-evaluated prior to inductionOxygen Delivery Method: Circle system utilized Preoxygenation: Pre-oxygenation with 100% oxygen Intubation Type: IV induction Ventilation: Mask ventilation without difficulty Laryngoscope Size: Mac and 4 Grade View: Grade I Tube type: Oral Tube size: 7.0 mm Number of attempts: 1 Airway Equipment and Method: Stylet Placement Confirmation: ETT inserted through vocal cords under direct vision,  positive ETCO2 and breath sounds checked- equal and bilateral Secured at: 22 cm Tube secured with: Tape Dental Injury: Teeth and Oropharynx as per pre-operative assessment

## 2016-06-14 NOTE — Progress Notes (Signed)
Patient resting at this time. She says that her nausea has decreased, but rates her pain "9" if she moves at all in the bed. VSS. Paged Cordelia PenLaurie Easterwood PA for change in pain med.

## 2016-06-14 NOTE — Transfer of Care (Signed)
Immediate Anesthesia Transfer of Care Note  Patient: Kristin SievertJill E Doyle  Procedure(s) Performed: Procedure(s): ENDOSCOPIC RETROGRADE CHOLANGIOPANCREATOGRAPHY (ERCP) (N/A)  Patient Location: PACU  Anesthesia Type:General  Level of Consciousness: sedated  Airway & Oxygen Therapy: Patient Spontanous Breathing and Patient connected to face mask oxygen  Post-op Assessment: Report given to RN and Post -op Vital signs reviewed and stable  Post vital signs: Reviewed and stable  Last Vitals:  Vitals:   06/14/16 1357 06/14/16 1440  BP: 126/69 (!) 141/78  Pulse: 91 96  Resp: 17 16  Temp: 36.9 C     Last Pain:  Vitals:   06/14/16 1357  TempSrc: Oral  PainSc:       Patients Stated Pain Goal: 2 (06/13/16 2340)  Complications: No apparent anesthesia complications

## 2016-06-14 NOTE — Progress Notes (Signed)
Patient complaining of sharp,stabbing pain in left chest.  Says it hurts to take a deep breath.  VSS. O2 sat 96% on room air.  Medicated pt with 1mg  Morphine and 4mg  Zofran IV.  Offered her scheduled Robaxin,but she did not want to take it at this time. Pt also stated that her throat felt swollen and scraped.  She is currently drinking a ginger ale. Notified Rapid Response RN, Verlon AuLeslie, who is coming to assess patient.

## 2016-06-15 LAB — CBC
HCT: 37.8 % (ref 36.0–46.0)
Hemoglobin: 12.2 g/dL (ref 12.0–15.0)
MCH: 28.4 pg (ref 26.0–34.0)
MCHC: 32.3 g/dL (ref 30.0–36.0)
MCV: 87.9 fL (ref 78.0–100.0)
PLATELETS: 249 10*3/uL (ref 150–400)
RBC: 4.3 MIL/uL (ref 3.87–5.11)
RDW: 14.3 % (ref 11.5–15.5)
WBC: 7.7 10*3/uL (ref 4.0–10.5)

## 2016-06-15 MED ORDER — LOPERAMIDE HCL 2 MG PO CAPS
2.0000 mg | ORAL_CAPSULE | ORAL | Status: DC | PRN
  Administered 2016-06-15: 2 mg via ORAL
  Filled 2016-06-15: qty 1

## 2016-06-15 MED ORDER — MORPHINE SULFATE (PF) 2 MG/ML IV SOLN
2.0000 mg | INTRAVENOUS | Status: DC | PRN
  Administered 2016-06-15 – 2016-06-16 (×7): 2 mg via INTRAVENOUS
  Administered 2016-06-16: 1 mg via INTRAVENOUS
  Administered 2016-06-17: 2 mg via INTRAVENOUS
  Filled 2016-06-15 (×10): qty 1

## 2016-06-15 MED ORDER — PHENOL 1.4 % MT LIQD
1.0000 | OROMUCOSAL | Status: DC | PRN
  Administered 2016-06-15: 1 via OROMUCOSAL
  Filled 2016-06-15: qty 177

## 2016-06-15 MED ORDER — MORPHINE SULFATE (PF) 2 MG/ML IV SOLN: 2.0000 mg | Freq: Once | INTRAVENOUS | Status: DC

## 2016-06-15 NOTE — Progress Notes (Signed)
Subjective: Pain is better.   Objective: Vital signs in last 24 hours: Temp:  [97.5 F (36.4 C)-99.1 F (37.3 C)] 98.1 F (36.7 C) (08/21 0655) Pulse Rate:  [91-112] 109 (08/21 0655) Resp:  [16-25] 18 (08/21 0655) BP: (111-141)/(63-88) 141/74 (08/21 0655) SpO2:  [93 %-100 %] 94 % (08/21 0655) Last BM Date: 06/14/16  Intake/Output from previous day: 08/20 0701 - 08/21 0700 In: 841.3 [I.V.:841.3] Out: 1100 [Urine:1100] Intake/Output this shift: Total I/O In: 41.3 [I.V.:41.3] Out: 500 [Urine:500]  General appearance: alert and uncomfortable appearing GI: diffuse tenderness  Lab Results:  Recent Labs  06/13/16 2330 06/15/16 0510  WBC 7.6 7.7  HGB 11.5* 12.2  HCT 35.5* 37.8  PLT 210 249   BMET  Recent Labs  06/13/16 2330 06/14/16 1923  NA 134* 131*  K 3.8 4.1  CL 100* 100*  CO2 27 23  GLUCOSE 130* 94  BUN 16 17  CREATININE 0.69 0.71  CALCIUM 8.6* 7.8*   LFT  Recent Labs  06/14/16 1923  PROT 6.6  ALBUMIN 2.6*  AST 29  ALT 28  ALKPHOS 120  BILITOT 1.2   PT/INR  Recent Labs  06/13/16 2330  LABPROT 13.7  INR 1.05   Hepatitis Panel No results for input(s): HEPBSAG, HCVAB, HEPAIGM, HEPBIGM in the last 72 hours. C-Diff No results for input(s): CDIFFTOX in the last 72 hours. Fecal Lactopherrin No results for input(s): FECLLACTOFRN in the last 72 hours.  Studies/Results: Dg Chest Port 1 View  Result Date: 06/14/2016 CLINICAL DATA:  Chest pain EXAM: PORTABLE CHEST 1 VIEW COMPARISON:  June 10, 2016 FINDINGS: The study is limited due to the low volume portable technique. Mild bibasilar atelectasis. No other interval changes. IMPRESSION: Limited study as above.  Mild bibasilar atelectasis. Electronically Signed   By: Gerome Samavid  Williams III M.D   On: 06/14/2016 21:34   Dg Ercp Biliary & Pancreatic Ducts  Result Date: 06/14/2016 CLINICAL DATA:  Bile duct leak EXAM: ERCP TECHNIQUE: Multiple spot images obtained with the fluoroscopic device and  submitted for interpretation post-procedure. COMPARISON:  HIDA scan 06/13/2016.  Abdominal CT 06/11/2016 FINDINGS: Three right upper quadrant images show sequential stages of biliary plastic stent placement. Faint cholangiogram without visible extravasation on this static image. On the final image the stent overlaps the CBD and duodenum. These images were submitted for radiologic interpretation only. Please see the procedural report for the amount of contrast and the fluoroscopy time utilized. IMPRESSION: ERCP with biliary stenting. Electronically Signed   By: Marnee SpringJonathon  Watts M.D.   On: 06/14/2016 22:13    Medications:  Scheduled: . enoxaparin (LOVENOX) injection  40 mg Subcutaneous Daily  . levothyroxine  125 mcg Oral QAC breakfast  . methocarbamol  750 mg Oral Q6H  .  morphine injection  2 mg Intravenous Once  . pantoprazole  40 mg Oral Daily  . senna-docusate  2 tablet Oral Daily   Continuous: . sodium chloride 75 mL/hr at 06/15/16 16100527    Assessment/Plan: 1) Bile leak s/p stent placement. 2) ABM pain.   Her pain may have been increased with some of the contrast extravasation.  She reports that her pain is the same as her presenting pain.  There is no evidence of a post-ERCP pancreatitis.  The PD was never cannulated and the procedure itself was 10 minutes from start to finish.  Plan: 1) Continue with pain control. 2) Continue NPO.  LOS: 2 days   Belladonna Lubinski D 06/15/2016, 6:57 AM

## 2016-06-15 NOTE — Progress Notes (Signed)
PROGRESS NOTE    Kristin SievertJill E Doyle  BJY:[id number] DOB: 08/07/1966 DOA: 06/13/2016 PCP: Virl DiamondPALLONE, AMANDA W, MD    Brief Narrative:  50yo [facility]  However, on Wednesday, developed epigastric discomfort radiating to shoulder.  HIDA on the morning of admission showed tracer in the abdomen which was interpreted as a post-op bile leak.  She was admitted for ERCP for correction.    Assessment & Plan:   Bile leak, postoperative -GI consulted and underwent ERCP, underwent stent placement.  - Pain control with percocet and morphine - Nausea control with zofran - discomfort with regular diet, changed to clear liquid diet.  Hypothyroidism, acquired - Continue synthroid  GERD/hiatal hernia - continue pantoprazole  Sciatica - Continue robaxin  Anemia, normocytic - Mild, possibly post op - recheck shows hemoglobin of 12.   Hyponatremia: mild and continue monitor.    DVT prophylaxis: SCDs, can do lovenox after procedure if stays in hospital Code Status: Full Family Communication: husband and family at bedside Disposition Plan: possibly in 1 to 2 days, when pain is controlled.    Consultants:   GI Dr Elnoria HowardHung.   Procedures:   ERCP 8/20  Antimicrobials:   Ancef prior to procedure    Subjective: Pain better controlled   Objective: Vitals:   06/15/16 0114 06/15/16 0300 06/15/16 0655 06/15/16 1344  BP: 122/69 126/71 (!) 141/74 137/81  Pulse: (!) 110 (!) 109 (!) 109 (!) 101  Resp: 18 17 18 18   Temp: 97.9 F (36.6 C) 98.4 F (36.9 C) 98.1 F (36.7 C) 99.7 F (37.6 C)  TempSrc: Oral   Oral  SpO2: 95% 98% 94% 98%  Weight:      Height:        Intake/Output Summary (Last 24 hours) at 06/15/16 1806 Last data filed at 06/15/16 1345  Gross per 24 hour  Intake           401.25 ml  Output             2004 ml  Net         -1602.75 ml   Filed Weights   06/13/16 2156  Weight: 119.3 kg (263 lb 0.1 oz)     Examination:  General exam: not in any distress.  Eyes: Anicteric sclerae, no conjunctival pallor Respiratory system: Clear to auscultation. Respiratory effort normal. No wheezing or rhonchi.  Cardiovascular system: S1 & S2 heard, RR, NR. No murmurs, rubs, gallops or clicks. No pedal edema. Gastrointestinal system: Abdomen is obese, soft and tender  Generalized. . Normal bowel sounds heard. Central nervous system: Alert and oriented. No gross neurological deficits. Skin: No rashes, lesions or ulcers Psychiatry: Judgement and insight appear normal. Mood & affect appropriate.     Data Reviewed:  CBC:  Recent Labs Lab 06/13/16 2330 06/15/16 0510  WBC 7.6 7.7  NEUTROABS 6.1  --   HGB 11.5* 12.2  HCT 35.5* 37.8  MCV 88.1 87.9  PLT 210 249   Basic Metabolic Panel:  Recent Labs Lab 06/13/16 2330 06/14/16 1923  NA 134* 131*  K 3.8 4.1  CL 100* 100*  CO2 27 23  GLUCOSE 130* 94  BUN 16 17  CREATININE 0.69 0.71  CALCIUM 8.6* 7.8*   GFR: Estimated Creatinine Clearance: 115 mL/min (by C-G formula based on SCr of 0.8 mg/dL). Liver Function Tests:  Recent Labs Lab 06/13/16 2330 06/14/16 1923  AST 27 29  ALT 27 28  ALKPHOS 110 120  BILITOT 1.8*  1.2  PROT 6.5 6.6  ALBUMIN 2.8* 2.6*   No results for input(s): LIPASE, AMYLASE in the last 168 hours. No results for input(s): AMMONIA in the last 168 hours. Coagulation Profile:  Recent Labs Lab 06/13/16 2330  INR 1.05   Cardiac Enzymes: No results for input(s): CKTOTAL, CKMB, CKMBINDEX, TROPONINI in the last 168 hours. BNP (last 3 results) No results for input(s): PROBNP in the last 8760 hours. HbA1C: No results for input(s): HGBA1C in the last 72 hours. CBG: No results for input(s): GLUCAP in the last 168 hours. Lipid Profile: No results for input(s): CHOL, HDL, LDLCALC, TRIG, CHOLHDL, LDLDIRECT in the last 72 hours. Thyroid Function Tests: No results for input(s): TSH, T4TOTAL, FREET4, T3FREE, THYROIDAB  in the last 72 hours. Anemia Panel: No results for input(s): VITAMINB12, FOLATE, FERRITIN, TIBC, IRON, RETICCTPCT in the last 72 hours. Sepsis Labs: No results for input(s): PROCALCITON, LATICACIDVEN in the last 168 hours.  No results found for this or any previous visit (from the past 240 hour(s)).       Radiology Studies: Dg Chest Port 1 View  Result Date: 06/14/2016 CLINICAL DATA:  Chest pain EXAM: PORTABLE CHEST 1 VIEW COMPARISON:  June 10, 2016 FINDINGS: The study is limited due to the low volume portable technique. Mild bibasilar atelectasis. No other interval changes. IMPRESSION: Limited study as above.  Mild bibasilar atelectasis. Electronically Signed   By: Gerome Samavid  Williams III M.D   On: 06/14/2016 21:34   Dg Ercp Biliary & Pancreatic Ducts  Result Date: 06/14/2016 CLINICAL DATA:  Bile duct leak EXAM: ERCP TECHNIQUE: Multiple spot images obtained with the fluoroscopic device and submitted for interpretation post-procedure. COMPARISON:  HIDA scan 06/13/2016.  Abdominal CT 06/11/2016 FINDINGS: Three right upper quadrant images show sequential stages of biliary plastic stent placement. Faint cholangiogram without visible extravasation on this static image. On the final image the stent overlaps the CBD and duodenum. These images were submitted for radiologic interpretation only. Please see the procedural report for the amount of contrast and the fluoroscopy time utilized. IMPRESSION: ERCP with biliary stenting. Electronically Signed   By: Marnee SpringJonathon  Watts M.D.   On: 06/14/2016 22:13        Scheduled Meds: . enoxaparin (LOVENOX) injection  40 mg Subcutaneous Daily  . levothyroxine  125 mcg Oral QAC breakfast  . methocarbamol  750 mg Oral Q6H  .  morphine injection  2 mg Intravenous Once  . pantoprazole  40 mg Oral Daily  . senna-docusate  2 tablet Oral Daily   Continuous Infusions:    LOS: 2 days    Time spent: 25 minutes    Amunique Neyra, MD [facility] Pager  [phone]  If 7PM-7AM, please contact night-coverage [email or web address] Password [email or web address] 06/15/2016, 6:06 PM

## 2016-06-15 NOTE — Progress Notes (Signed)
Patient medicated with 2mg  Morphine at 0113. Now requesting pain medication for pain rated "10" out of "10" in upper abdominal area. She states that the area feels swollen and "hot". Her abdomen is soft,obese,and tender to touch.  Notified Cordelia PenLaurie Easterwood PA since patient's pain med not due for another hour. Received order to medicate patient with Morphine now.

## 2016-06-16 ENCOUNTER — Encounter (HOSPITAL_COMMUNITY): Payer: Self-pay | Admitting: Gastroenterology

## 2016-06-16 DIAGNOSIS — R197 Diarrhea, unspecified: Secondary | ICD-10-CM

## 2016-06-16 DIAGNOSIS — K1370 Unspecified lesions of oral mucosa: Secondary | ICD-10-CM

## 2016-06-16 DIAGNOSIS — E871 Hypo-osmolality and hyponatremia: Secondary | ICD-10-CM

## 2016-06-16 DIAGNOSIS — E860 Dehydration: Secondary | ICD-10-CM

## 2016-06-16 LAB — BASIC METABOLIC PANEL
Anion gap: 8 (ref 5–15)
BUN: 8 mg/dL (ref 6–20)
CO2: 22 mmol/L (ref 22–32)
CREATININE: 0.56 mg/dL (ref 0.44–1.00)
Calcium: 7.8 mg/dL — ABNORMAL LOW (ref 8.9–10.3)
Chloride: 104 mmol/L (ref 101–111)
GFR calc Af Amer: >60 mL/min (ref >60)
GLUCOSE: 96 mg/dL (ref 65–99)
Potassium: 3.8 mmol/L (ref 3.5–5.1)
SODIUM: 134 mmol/L — AB (ref 135–145)

## 2016-06-16 LAB — CBC
HCT: 32.8 % — ABNORMAL LOW (ref 36.0–46.0)
Hemoglobin: 10.6 g/dL — ABNORMAL LOW (ref 12.0–15.0)
MCH: 28.2 pg (ref 26.0–34.0)
MCHC: 32.3 g/dL (ref 30.0–36.0)
MCV: 87.2 fL (ref 78.0–100.0)
PLATELETS: 219 10*3/uL (ref 150–400)
RBC: 3.76 MIL/uL — ABNORMAL LOW (ref 3.87–5.11)
RDW: 14.3 % (ref 11.5–15.5)
WBC: 9.9 10*3/uL (ref 4.0–10.5)

## 2016-06-16 LAB — C DIFFICILE QUICK SCREEN W PCR REFLEX
C DIFFICILE (CDIFF) TOXIN: NEGATIVE
C DIFFICLE (CDIFF) ANTIGEN: NEGATIVE
C Diff interpretation: NOT DETECTED

## 2016-06-16 MED ORDER — KETOROLAC TROMETHAMINE 30 MG/ML IJ SOLN
30.0000 mg | Freq: Three times a day (TID) | INTRAMUSCULAR | Status: DC | PRN
  Administered 2016-06-16 (×2): 30 mg via INTRAVENOUS
  Filled 2016-06-16 (×2): qty 1

## 2016-06-16 MED ORDER — SODIUM CHLORIDE 0.9 % IV BOLUS (SEPSIS)
1000.0000 mL | Freq: Once | INTRAVENOUS | Status: AC
  Administered 2016-06-16: 1000 mL via INTRAVENOUS

## 2016-06-16 MED FILL — Morphine Sulfate Inj 10 MG/ML: INTRAMUSCULAR | Qty: 1 | Status: AC

## 2016-06-16 MED FILL — Ondansetron HCl Inj 4 MG/2ML (2 MG/ML): INTRAMUSCULAR | Qty: 2 | Status: AC

## 2016-06-16 NOTE — Progress Notes (Signed)
Subjective: Difficult night last evening with low grade fever and diarrhea.  The abdominal cramping is the most troublesome for her.  No further bile duct leak pain.  Objective: Vital signs in last 24 hours: Temp:  [99.5 F (37.5 C)-100.2 F (37.9 C)] 99.5 F (37.5 C) (08/22 0519) Pulse Rate:  [101-116] 116 (08/22 0519) Resp:  [18-19] 19 (08/22 0519) BP: (116-137)/(53-81) 117/70 (08/22 0519) SpO2:  [94 %-98 %] 94 % (08/22 0519) Last BM Date: 06/15/16  Intake/Output from previous day: 08/21 0701 - 08/22 0700 In: 600 [P.O.:600] Out: 2004 [Urine:2000; Stool:4] Intake/Output this shift: No intake/output data recorded.  General appearance: alert and uncomfortable GI: soft, some tenderness  Lab Results:  Recent Labs  06/13/16 2330 06/15/16 0510  WBC 7.6 7.7  HGB 11.5* 12.2  HCT 35.5* 37.8  PLT 210 249   BMET  Recent Labs  06/13/16 2330 06/14/16 1923  NA 134* 131*  K 3.8 4.1  CL 100* 100*  CO2 27 23  GLUCOSE 130* 94  BUN 16 17  CREATININE 0.69 0.71  CALCIUM 8.6* 7.8*   LFT  Recent Labs  06/14/16 1923  PROT 6.6  ALBUMIN 2.6*  AST 29  ALT 28  ALKPHOS 120  BILITOT 1.2   PT/INR  Recent Labs  06/13/16 2330  LABPROT 13.7  INR 1.05   Hepatitis Panel No results for input(s): HEPBSAG, HCVAB, HEPAIGM, HEPBIGM in the last 72 hours. C-Diff No results for input(s): CDIFFTOX in the last 72 hours. Fecal Lactopherrin No results for input(s): FECLLACTOFRN in the last 72 hours.  Studies/Results: Dg Chest Port 1 View  Result Date: 06/14/2016 CLINICAL DATA:  Chest pain EXAM: PORTABLE CHEST 1 VIEW COMPARISON:  June 10, 2016 FINDINGS: The study is limited due to the low volume portable technique. Mild bibasilar atelectasis. No other interval changes. IMPRESSION: Limited study as above.  Mild bibasilar atelectasis. Electronically Signed   By: Gerome Samavid  Williams III M.D   On: 06/14/2016 21:34   Dg Ercp Biliary & Pancreatic Ducts  Result Date: 06/14/2016 CLINICAL  DATA:  Bile duct leak EXAM: ERCP TECHNIQUE: Multiple spot images obtained with the fluoroscopic device and submitted for interpretation post-procedure. COMPARISON:  HIDA scan 06/13/2016.  Abdominal CT 06/11/2016 FINDINGS: Three right upper quadrant images show sequential stages of biliary plastic stent placement. Faint cholangiogram without visible extravasation on this static image. On the final image the stent overlaps the CBD and duodenum. These images were submitted for radiologic interpretation only. Please see the procedural report for the amount of contrast and the fluoroscopy time utilized. IMPRESSION: ERCP with biliary stenting. Electronically Signed   By: Marnee SpringJonathon  Watts M.D.   On: 06/14/2016 22:13    Medications:  Scheduled: . enoxaparin (LOVENOX) injection  40 mg Subcutaneous Daily  . levothyroxine  125 mcg Oral QAC breakfast  . methocarbamol  750 mg Oral Q6H  .  morphine injection  2 mg Intravenous Once  . pantoprazole  40 mg Oral Daily  . senna-docusate  2 tablet Oral Daily   Continuous:   Assessment/Plan: 1) S/p bile leak. 2) Abdominal cramping. 3) Diarrhea - appears resolved. 4) Low grade fever. 5) Sore throat.   The original issue with the bile leak appears to be resolved.  She states that the pain from the leak is none to minimal.  I am not certain why she continues to have issues with abdominal cramping, but she reports that she is slowly improving.  Methocarbamol is very helpful for her.  She had a low  grade fever at 100.2 overnight and she had diarrhea last evening.  No further diarrhea since dinner time.  If the diarrhea persists, then C. Diff needs to be evaluated.  No WBC today, but it will be helpful to track this issue.  Plan: 1) No further GI intervention.  I will arrange for outpatient follow up to manage the stent. 2) Signing off. 3) Defer other issues to Hospitalist.  Call if assistance is needed again.  LOS: 3 days   Metta Koranda D 06/16/2016, 7:01 AM

## 2016-06-16 NOTE — Progress Notes (Signed)
PROGRESS NOTE    Kristin Doyle  ZOX:[id number] DOB: June 17, 1966 DOA: 06/13/2016 PCP: Virl Diamond, MD    Brief Narrative:  50yo woman with cholecystectomy on 06/03/16 at [facility].  However, on 06/13/16, developed epigastric discomfort radiating to shoulder.  HIDA on the morning of admission showed tracer in the abdomen which was interpreted as a post-op bile leak.  She was admitted for ERCP for correction.    Assessment & Plan:   Bile leak, postoperative -GI consulted and underwent ERCP, underwent stent placement. Signed off as of 06/15/16 and will see as outpt.  - Pain worsened overnight but now improving but now has developed diarrhea. 4x episodes of watery diarrhea this am.  - KUB if pain returns - feeling swimmy headed with ambulation - Nausea control with zofran - tolerating clear liquid diet  - ADAT, orthostatics, BMP and CBC ordered, Cdiff ordered (received Ancef perioperatively), 1L NS bolus  Uvula Irritation: pain and irritation since intubation for ERCP,. Touching back of tongue and causing intermittent gagging. Small white/yellow plaque on exam. Suspect mild trauma from intubation that will require a couple days to heal. No obstruction - Cepacol prn - Toradol prn  Hypothyroidism, acquired - Continue synthroid  GERD/hiatal hernia - continue pantoprazole  Sciatica - Continue robaxin  Anemia, normocytic - Mild, possibly post op - recheck shows hemoglobin of 12.   Hyponatremia: mild and continue monitor.  - f/u BMP - NS   DVT prophylaxis: SCDs, can do lovenox after procedure if stays in hospital Code Status: Full Family Communication: husband and family at bedside Disposition Plan: suspect DC on 06/17/16   Consultants:   GI Dr Elnoria Howard.   Procedures:   ERCP 8/20  Antimicrobials:   Ancef prior to procedure    Subjective: Pain overnight requiring additional pain meds. Development of diarrhea and mild dizziness with ambulation. Uvula  irritation since intubation. Causing gagging with speech and swallowing from time to time. Irritated. Denies difficulty with swallowing or breathing.  Objective: Vitals:   06/15/16 1344 06/15/16 2110 06/16/16 0418 06/16/16 0519  BP: 137/81 (!) 116/53  117/70  Pulse: (!) 101 (!) 108  (!) 116  Resp: 18   19  Temp: 99.7 F (37.6 C) 99.7 F (37.6 C) 100.2 F (37.9 C) 99.5 F (37.5 C)  TempSrc: Oral Oral Oral Oral  SpO2: 98%   94%  Weight:      Height:        Intake/Output Summary (Last 24 hours) at 06/16/16 1038 Last data filed at 06/16/16 0916  Gross per 24 hour  Intake              600 ml  Output             1703 ml  Net            -1103 ml   Filed Weights   06/13/16 2156  Weight: 119.3 kg (263 lb 0.1 oz)    Examination:  General exam: not in any distress.  Eyes: Anicteric sclerae, no conjunctival pallor ENT: Uvula elongated and touching the back of the tongue. Whitish yellow plaque formation along the distal one fourth of the uvula. Normal caliber. Respiratory system: Clear to auscultation. Respiratory effort normal. No wheezing or rhonchi.  Cardiovascular system: S1 & S2 heard, RR, NR. No murmurs, rubs, gallops or clicks. No pedal edema. Gastrointestinal system: Abdomen is obese, soft and tender  Generalized. . Normal bowel sounds heard. Central nervous system: Alert and oriented. No gross neurological deficits.  Skin: No rashes, lesions or ulcers Psychiatry: Judgement and insight appear normal. Mood & affect appropriate.     Data Reviewed:  CBC:  Recent Labs Lab 06/13/16 2330 06/15/16 0510  WBC 7.6 7.7  NEUTROABS 6.1  --   HGB 11.5* 12.2  HCT 35.5* 37.8  MCV 88.1 87.9  PLT 210 249   Basic Metabolic Panel:  Recent Labs Lab 06/13/16 2330 06/14/16 1923  NA 134* 131*  K 3.8 4.1  CL 100* 100*  CO2 27 23  GLUCOSE 130* 94  BUN 16 17  CREATININE 0.69 0.71  CALCIUM 8.6* 7.8*   GFR: Estimated Creatinine Clearance: 115 mL/min (by C-G formula based on  SCr of 0.8 mg/dL). Liver Function Tests:  Recent Labs Lab 06/13/16 2330 06/14/16 1923  AST 27 29  ALT 27 28  ALKPHOS 110 120  BILITOT 1.8* 1.2  PROT 6.5 6.6  ALBUMIN 2.8* 2.6*   No results for input(s): LIPASE, AMYLASE in the last 168 hours. No results for input(s): AMMONIA in the last 168 hours. Coagulation Profile:  Recent Labs Lab 06/13/16 2330  INR 1.05   Cardiac Enzymes: No results for input(s): CKTOTAL, CKMB, CKMBINDEX, TROPONINI in the last 168 hours. BNP (last 3 results) No results for input(s): PROBNP in the last 8760 hours. HbA1C: No results for input(s): HGBA1C in the last 72 hours. CBG: No results for input(s): GLUCAP in the last 168 hours. Lipid Profile: No results for input(s): CHOL, HDL, LDLCALC, TRIG, CHOLHDL, LDLDIRECT in the last 72 hours. Thyroid Function Tests: No results for input(s): TSH, T4TOTAL, FREET4, T3FREE, THYROIDAB in the last 72 hours. Anemia Panel: No results for input(s): VITAMINB12, FOLATE, FERRITIN, TIBC, IRON, RETICCTPCT in the last 72 hours. Sepsis Labs: No results for input(s): PROCALCITON, LATICACIDVEN in the last 168 hours.  No results found for this or any previous visit (from the past 240 hour(s)).       Radiology Studies: Dg Chest Port 1 View  Result Date: 06/14/2016 CLINICAL DATA:  Chest pain EXAM: PORTABLE CHEST 1 VIEW COMPARISON:  June 10, 2016 FINDINGS: The study is limited due to the low volume portable technique. Mild bibasilar atelectasis. No other interval changes. IMPRESSION: Limited study as above.  Mild bibasilar atelectasis. Electronically Signed   By: Gerome Samavid  Williams III M.D   On: 06/14/2016 21:34   Dg Ercp Biliary & Pancreatic Ducts  Result Date: 06/14/2016 CLINICAL DATA:  Bile duct leak EXAM: ERCP TECHNIQUE: Multiple spot images obtained with the fluoroscopic device and submitted for interpretation post-procedure. COMPARISON:  HIDA scan 06/13/2016.  Abdominal CT 06/11/2016 FINDINGS: Three right upper  quadrant images show sequential stages of biliary plastic stent placement. Faint cholangiogram without visible extravasation on this static image. On the final image the stent overlaps the CBD and duodenum. These images were submitted for radiologic interpretation only. Please see the procedural report for the amount of contrast and the fluoroscopy time utilized. IMPRESSION: ERCP with biliary stenting. Electronically Signed   By: Marnee SpringJonathon  Watts M.D.   On: 06/14/2016 22:13        Scheduled Meds: . enoxaparin (LOVENOX) injection  40 mg Subcutaneous Daily  . levothyroxine  125 mcg Oral QAC breakfast  . methocarbamol  750 mg Oral Q6H  .  morphine injection  2 mg Intravenous Once  . pantoprazole  40 mg Oral Daily  . senna-docusate  2 tablet Oral Daily   Continuous Infusions:    LOS: 3 days      Gyneth Hubka, Elmon ElseAVID J, MD [facility] Pager  [phone]  If 7PM-7AM, please contact night-coverage [email or web address] Password [email or web address] 06/16/2016, 10:38 AM

## 2016-06-17 DIAGNOSIS — K838 Other specified diseases of biliary tract: Secondary | ICD-10-CM

## 2016-06-17 DIAGNOSIS — K929 Disease of digestive system, unspecified: Secondary | ICD-10-CM

## 2016-06-17 LAB — BASIC METABOLIC PANEL
ANION GAP: 8 (ref 5–15)
BUN: 6 mg/dL (ref 6–20)
CALCIUM: 7.8 mg/dL — AB (ref 8.9–10.3)
CO2: 23 mmol/L (ref 22–32)
Chloride: 105 mmol/L (ref 101–111)
Creatinine, Ser: 0.66 mg/dL (ref 0.44–1.00)
GLUCOSE: 104 mg/dL — AB (ref 65–99)
POTASSIUM: 3.3 mmol/L — AB (ref 3.5–5.1)
SODIUM: 136 mmol/L (ref 135–145)

## 2016-06-17 LAB — CBC
HCT: 29.9 % — ABNORMAL LOW (ref 36.0–46.0)
HEMOGLOBIN: 9.7 g/dL — AB (ref 12.0–15.0)
MCH: 28 pg (ref 26.0–34.0)
MCHC: 32.4 g/dL (ref 30.0–36.0)
MCV: 86.4 fL (ref 78.0–100.0)
Platelets: 229 10*3/uL (ref 150–400)
RBC: 3.46 MIL/uL — ABNORMAL LOW (ref 3.87–5.11)
RDW: 14.4 % (ref 11.5–15.5)
WBC: 9.9 10*3/uL (ref 4.0–10.5)

## 2016-06-17 MED ORDER — NAPROXEN SODIUM 550 MG PO TABS
550.0000 mg | ORAL_TABLET | Freq: Every day | ORAL | Status: DC
  Administered 2016-06-17: 550 mg via ORAL
  Filled 2016-06-17: qty 1

## 2016-06-17 MED ORDER — HYOSCYAMINE SULFATE ER 0.375 MG PO TB12: 0.3750 mg | ORAL_TABLET | Freq: Two times a day (BID) | ORAL | 0 refills | Status: AC | PRN

## 2016-06-17 MED ORDER — POTASSIUM CHLORIDE CRYS ER 20 MEQ PO TBCR
40.0000 meq | EXTENDED_RELEASE_TABLET | Freq: Once | ORAL | Status: AC
  Administered 2016-06-17: 40 meq via ORAL
  Filled 2016-06-17: qty 2

## 2016-06-17 MED ORDER — LOPERAMIDE HCL 2 MG PO CAPS: 2.0000 mg | ORAL_CAPSULE | ORAL | 0 refills | Status: AC | PRN

## 2016-06-17 MED ORDER — HYOSCYAMINE SULFATE ER 0.375 MG PO TB12
0.3750 mg | ORAL_TABLET | Freq: Two times a day (BID) | ORAL | Status: DC | PRN
  Administered 2016-06-17: 0.375 mg via ORAL
  Filled 2016-06-17 (×2): qty 1

## 2016-06-17 NOTE — Discharge Summary (Signed)
Physician Discharge Summary  Kristin SievertJill E Ashmore WUJ:[id number] DOB: 08/12/1966 DOA: 06/13/2016  PCP: Virl DiamondPALLONE, AMANDA W, MD  Admit date: 06/13/2016 Discharge date: 06/17/2016  Time spent: 45 minutes  Recommendations for Outpatient Follow-up:  Gi Dr.Hung in 3-4 weeks for biliary stent FU  Discharge Diagnoses:  Principal Problem:   Bile leak, postoperative   Post cholecystectomy diarrhea   Hypothyroidism, acquired   Lesion of uvula   Diarrhea   Dehydration   Hyponatremia   Discharge Condition: stable  Diet recommendation: soft bland diet, advance as tolerated  Filed Weights   06/13/16 2156  Weight: 119.3 kg (263 lb 0.1 oz)    History of present illness:  Kristin Doyle is a 50 y.o. female with a past medical history significant for obesity and hypothroidism who presented with Abd pain. 10 days ago she [facility], had improvement of her symtoms and was back to work on Monday.   However, Wednesday night of this week after work she had again onset of dull epigastric discomfort that developed into "10/10" pain all over the chest, shoulders and radiating to the right shoulder (she thought she was having a heart attack).  [facility], had serially negative enzymes, normal EKG.  A CT angiogram of the chest, abdomen and pelvis was obtained that showed no PE and only normal post-operative changes and [facility] doubted post-operative complications given her initial improvement after surgery. 8/19 morning, she underwent a HIDA scan that showed ectopic tracer in the abdomen, interpreted as probable post-operative bile leak [facility]  Hospital Course:  Bile leak, postoperative -GI consulted and underwent ERCP, underwent stent placement.  -improved and Dr.Hung recommended FU in 4 weeks -after this had some pain and diarrhea which has improved with supportive  care  Hypothyroidism, acquired - Continue synthroid  GERD/hiatal hernia - continue pantoprazole  Hyponatremia -improved with hydration  Procedures: ERCP with stenting: 8/20 by Dr.Hung Impression:               - A bile leak was found. This was treated with                            stent placement.                           - One plastic stent was placed into the common bile                             duct.  Consultations:  Gi Dr.Hung  Discharge Exam: Vitals:   06/16/16 1959 06/17/16 0500  BP: 112/66 130/69  Pulse: 88 (!) 104  Resp: 18 19  Temp: 99 F (37.2 C) 97.9 F (36.6 C)    General: AAOx3 Cardiovascular: S1S2/RRR Respiratory: CTAB  Discharge Instructions   Discharge Instructions    Diet - low sodium heart healthy    Complete by:  As directed   Increase activity slowly    Complete by:  As directed     Current Discharge Medication List    START taking these medications   Details  hyoscyamine (LEVBID) 0.375 MG 12 hr tablet Take 1 tablet (0.375 mg total) by mouth every 12 (twelve) hours as needed for cramping. Qty: 15 tablet, Refills: 0    loperamide (  IMODIUM) 2 MG capsule Take 1 capsule (2 mg total) by mouth as needed for diarrhea or loose stools. Refills: 0      CONTINUE these medications which have NOT CHANGED   Details  levothyroxine (SYNTHROID, LEVOTHROID) 125 MCG tablet Take 125 mcg by mouth daily before breakfast.    naproxen sodium (ANAPROX) 550 MG tablet Take 220 mg by mouth daily.    omeprazole (PRILOSEC) 20 MG capsule Take 20 mg by mouth daily.    ranitidine (ZANTAC) 150 MG tablet Take 150 mg by mouth daily.       Allergies  Allergen Reactions  . Ciprofloxacin Swelling   Follow-up Information    HUNG,PATRICK D, MD. Schedule an appointment as soon as possible for a visit in 2 week(s).   Specialty:  Gastroenterology Contact information: [address] [place] [address] [phone]            The  results of significant diagnostics from this hospitalization (including imaging, microbiology, ancillary and laboratory) are listed below for reference.    Significant Diagnostic Studies: Dg Chest Port 1 View  Result Date: 06/14/2016 CLINICAL DATA:  Chest pain EXAM: PORTABLE CHEST 1 VIEW COMPARISON:  June 10, 2016 FINDINGS: The study is limited due to the low volume portable technique. Mild bibasilar atelectasis. No other interval changes. IMPRESSION: Limited study as above.  Mild bibasilar atelectasis. Electronically Signed   By: Gerome Sam III M.D   On: 06/14/2016 21:34   Dg Ercp Biliary & Pancreatic Ducts  Result Date: 06/14/2016 CLINICAL DATA:  Bile duct leak EXAM: ERCP TECHNIQUE: Multiple spot images obtained with the fluoroscopic device and submitted for interpretation post-procedure. COMPARISON:  HIDA scan 06/13/2016.  Abdominal CT 06/11/2016 FINDINGS: Three right upper quadrant images show sequential stages of biliary plastic stent placement. Faint cholangiogram without visible extravasation on this static image. On the final image the stent overlaps the CBD and duodenum. These images were submitted for radiologic interpretation only. Please see the procedural report for the amount of contrast and the fluoroscopy time utilized. IMPRESSION: ERCP with biliary stenting. Electronically Signed   By: Marnee Spring M.D.   On: 06/14/2016 22:13    Microbiology: Recent Results (from the past 240 hour(s))  C difficile quick scan w PCR reflex     Status: None   Collection Time: 06/16/16  1:33 PM  Result Value Ref Range Status   C Diff antigen NEGATIVE NEGATIVE Final   C Diff toxin NEGATIVE NEGATIVE Final   C Diff interpretation No C. difficile detected.  Final     Labs: Basic Metabolic Panel:  Recent Labs Lab 06/13/16 2330 06/14/16 1923 06/16/16 1221 06/17/16 0554  NA 134* 131* 134* 136  K 3.8 4.1 3.8 3.3*  CL 100* 100* 104 105  CO2 27 23 22 23   GLUCOSE 130* 94 96 104*  BUN  16 17 8 6   CREATININE 0.69 0.71 0.56 0.66  CALCIUM 8.6* 7.8* 7.8* 7.8*   Liver Function Tests:  Recent Labs Lab 06/13/16 2330 06/14/16 1923  AST 27 29  ALT 27 28  ALKPHOS 110 120  BILITOT 1.8* 1.2  PROT 6.5 6.6  ALBUMIN 2.8* 2.6*   No results for input(s): LIPASE, AMYLASE in the last 168 hours. No results for input(s): AMMONIA in the last 168 hours. CBC:  Recent Labs Lab 06/13/16 2330 06/15/16 0510 06/16/16 1221 06/17/16 0554  WBC 7.6 7.7 9.9 9.9  NEUTROABS 6.1  --   --   --   HGB 11.5* 12.2 10.6*  9.7*  HCT 35.5* 37.8 32.8* 29.9*  MCV 88.1 87.9 87.2 86.4  PLT 210 249 219 229   Cardiac Enzymes: No results for input(s): CKTOTAL, CKMB, CKMBINDEX, TROPONINI in the last 168 hours. BNP: BNP (last 3 results) No results for input(s): BNP in the last 8760 hours.  ProBNP (last 3 results) No results for input(s): PROBNP in the last 8760 hours.  CBG: No results for input(s): GLUCAP in the last 168 hours.     SignedZannie Cove:  Andrea Colglazier MD.  [facility] 06/17/2016, 1:36 PM

## 2016-06-17 NOTE — Progress Notes (Addendum)
1400 Pt verbalized of feeling better today. Tolerated soft diet. Had few loose stools after lunch. Pt wanted to go home today. Dr Jomarie LongsJoseph notified. Received discharge order. Discharge instructions given to pt, verbalized understanding. Pt wants to leave later today. 1500 Pt is ambulating to the hall, mild SOB when ambulating which she has been experiencing for the past few days, relieved w/ rest. O2 sats 99% at room air.

## 2016-07-02 ENCOUNTER — Other Ambulatory Visit: Payer: Self-pay | Admitting: Gastroenterology

## 2016-08-27 ENCOUNTER — Encounter (HOSPITAL_COMMUNITY): Payer: Self-pay | Admitting: *Deleted

## 2016-08-28 ENCOUNTER — Ambulatory Visit (HOSPITAL_COMMUNITY): Payer: BLUE CROSS/BLUE SHIELD

## 2016-08-28 ENCOUNTER — Encounter (HOSPITAL_COMMUNITY)
Admission: RE | Disposition: A | Discharge status: Home / Self Care | Payer: Self-pay | Source: Ambulatory Visit | Attending: Gastroenterology

## 2016-08-28 ENCOUNTER — Ambulatory Visit (HOSPITAL_COMMUNITY)
Admission: RE | Admit: 2016-08-28 | Discharge: 2016-08-28 | Disposition: A | Discharge status: Home / Self Care | Payer: BLUE CROSS/BLUE SHIELD | Source: Ambulatory Visit | Attending: Gastroenterology | Admitting: Gastroenterology

## 2016-08-28 ENCOUNTER — Ambulatory Visit (HOSPITAL_COMMUNITY): Payer: BLUE CROSS/BLUE SHIELD | Admitting: Anesthesiology

## 2016-08-28 ENCOUNTER — Encounter (HOSPITAL_COMMUNITY): Payer: Self-pay | Admitting: Anesthesiology

## 2016-08-28 DIAGNOSIS — Z881 Allergy status to other antibiotic agents status: Secondary | ICD-10-CM | POA: Insufficient documentation

## 2016-08-28 DIAGNOSIS — Z87891 Personal history of nicotine dependence: Secondary | ICD-10-CM | POA: Insufficient documentation

## 2016-08-28 DIAGNOSIS — Y838 Other surgical procedures as the cause of abnormal reaction of the patient, or of later complication, without mention of misadventure at the time of the procedure: Secondary | ICD-10-CM | POA: Diagnosis not present

## 2016-08-28 DIAGNOSIS — K219 Gastro-esophageal reflux disease without esophagitis: Secondary | ICD-10-CM | POA: Insufficient documentation

## 2016-08-28 DIAGNOSIS — Z8249 Family history of ischemic heart disease and other diseases of the circulatory system: Secondary | ICD-10-CM | POA: Diagnosis not present

## 2016-08-28 DIAGNOSIS — Z8052 Family history of malignant neoplasm of bladder: Secondary | ICD-10-CM | POA: Insufficient documentation

## 2016-08-28 DIAGNOSIS — E039 Hypothyroidism, unspecified: Secondary | ICD-10-CM | POA: Diagnosis not present

## 2016-08-28 DIAGNOSIS — Z9049 Acquired absence of other specified parts of digestive tract: Secondary | ICD-10-CM | POA: Insufficient documentation

## 2016-08-28 DIAGNOSIS — K9189 Other postprocedural complications and disorders of digestive system: Secondary | ICD-10-CM | POA: Insufficient documentation

## 2016-08-28 DIAGNOSIS — K449 Diaphragmatic hernia without obstruction or gangrene: Secondary | ICD-10-CM | POA: Diagnosis not present

## 2016-08-28 DIAGNOSIS — K828 Other specified diseases of gallbladder: Secondary | ICD-10-CM | POA: Diagnosis present

## 2016-08-28 DIAGNOSIS — K838 Other specified diseases of biliary tract: Secondary | ICD-10-CM | POA: Diagnosis not present

## 2016-08-28 DIAGNOSIS — Z4689 Encounter for fitting and adjustment of other specified devices: Secondary | ICD-10-CM

## 2016-08-28 HISTORY — PX: ERCP: SHX5425

## 2016-08-28 SURGERY — ERCP, WITH INTERVENTION IF INDICATED
Anesthesia: General

## 2016-08-28 MED ORDER — HYDROMORPHONE HCL 1 MG/ML IJ SOLN: 0.2500 mg | INTRAMUSCULAR | Status: DC | PRN

## 2016-08-28 MED ORDER — DEXAMETHASONE SODIUM PHOSPHATE 10 MG/ML IJ SOLN
INTRAMUSCULAR | Status: AC
  Filled 2016-08-28: qty 1

## 2016-08-28 MED ORDER — FENTANYL CITRATE (PF) 100 MCG/2ML IJ SOLN
INTRAMUSCULAR | Status: DC | PRN
  Administered 2016-08-28: 100 ug via INTRAVENOUS

## 2016-08-28 MED ORDER — LIDOCAINE 2% (20 MG/ML) 5 ML SYRINGE
INTRAMUSCULAR | Status: DC | PRN
  Administered 2016-08-28: 100 mg via INTRAVENOUS

## 2016-08-28 MED ORDER — SUGAMMADEX SODIUM 200 MG/2ML IV SOLN
INTRAVENOUS | Status: DC | PRN
Start: 2016-08-28 — End: 2016-08-28
  Administered 2016-08-28: 300 mg via INTRAVENOUS

## 2016-08-28 MED ORDER — CEFAZOLIN SODIUM-DEXTROSE 2-4 GM/100ML-% IV SOLN
2.0000 g | Freq: Once | INTRAVENOUS | Status: AC
  Administered 2016-08-28: 2 g via INTRAVENOUS
  Filled 2016-08-28: qty 100

## 2016-08-28 MED ORDER — LIDOCAINE 2% (20 MG/ML) 5 ML SYRINGE
INTRAMUSCULAR | Status: AC
  Filled 2016-08-28: qty 5

## 2016-08-28 MED ORDER — GLUCAGON HCL RDNA (DIAGNOSTIC) 1 MG IJ SOLR
INTRAMUSCULAR | Status: AC
  Filled 2016-08-28: qty 1

## 2016-08-28 MED ORDER — INDOMETHACIN 50 MG RE SUPP: 100.0000 mg | Freq: Once | RECTAL | Status: DC

## 2016-08-28 MED ORDER — ONDANSETRON HCL 4 MG/2ML IJ SOLN
INTRAMUSCULAR | Status: AC
  Filled 2016-08-28: qty 2

## 2016-08-28 MED ORDER — INDOMETHACIN 50 MG RE SUPP
RECTAL | Status: DC | PRN
  Administered 2016-08-28: 100 mg via RECTAL

## 2016-08-28 MED ORDER — INDOMETHACIN 50 MG RE SUPP
RECTAL | Status: AC
  Filled 2016-08-28: qty 2

## 2016-08-28 MED ORDER — ONDANSETRON HCL 4 MG/2ML IJ SOLN: 4.0000 mg | Freq: Once | INTRAMUSCULAR | Status: DC | PRN

## 2016-08-28 MED ORDER — SODIUM CHLORIDE 0.9 % IV SOLN
INTRAVENOUS | Status: DC | PRN
  Administered 2016-08-28: 20 mL

## 2016-08-28 MED ORDER — DEXAMETHASONE SODIUM PHOSPHATE 10 MG/ML IJ SOLN
INTRAMUSCULAR | Status: DC | PRN
  Administered 2016-08-28: 10 mg via INTRAVENOUS

## 2016-08-28 MED ORDER — PROPOFOL 10 MG/ML IV BOLUS
INTRAVENOUS | Status: AC
  Filled 2016-08-28: qty 20

## 2016-08-28 MED ORDER — ROCURONIUM BROMIDE 10 MG/ML (PF) SYRINGE
PREFILLED_SYRINGE | INTRAVENOUS | Status: DC | PRN
  Administered 2016-08-28: 50 mg via INTRAVENOUS

## 2016-08-28 MED ORDER — PROPOFOL 10 MG/ML IV BOLUS
INTRAVENOUS | Status: DC | PRN
  Administered 2016-08-28: 100 mg via INTRAVENOUS
  Administered 2016-08-28: 200 mg via INTRAVENOUS

## 2016-08-28 MED ORDER — FENTANYL CITRATE (PF) 100 MCG/2ML IJ SOLN
INTRAMUSCULAR | Status: AC
  Filled 2016-08-28: qty 2

## 2016-08-28 MED ORDER — SUGAMMADEX SODIUM 500 MG/5ML IV SOLN
INTRAVENOUS | Status: AC
  Filled 2016-08-28: qty 5

## 2016-08-28 MED ORDER — MEPERIDINE HCL 100 MG/ML IJ SOLN: 6.2500 mg | INTRAMUSCULAR | Status: DC | PRN

## 2016-08-28 MED ORDER — ONDANSETRON HCL 4 MG/2ML IJ SOLN
INTRAMUSCULAR | Status: DC | PRN
  Administered 2016-08-28: 4 mg via INTRAVENOUS

## 2016-08-28 MED ORDER — LACTATED RINGERS IV SOLN
INTRAVENOUS | Status: DC
  Administered 2016-08-28: 1000 mL via INTRAVENOUS

## 2016-08-28 MED ORDER — SODIUM CHLORIDE 0.9 % IV SOLN: INTRAVENOUS | Status: DC

## 2016-08-28 NOTE — Op Note (Signed)
[facility] Name: Kristin HoughJill Speth Procedure Date: 08/28/2016 MRN: [id number] Attending MD: Jeani HawkingPatrick Saleena Tamas , MD Date of Birth: 09/03/1966 CSN: [id number] Age: 50 Admit Type: Outpatient Procedure:                ERCP Indications:              Bile leak Providers:                Jeani HawkingPatrick Marquez Ceesay, MD, Dwain SarnaPatricia Ford, RN, Michel BickersLisa R.                            Straughn, RN, Oletha Blendavida Shoffner, Technician Referring MD:              Medicines:                General Anesthesia and Indomethacin 100 mg PR. Complications:            No immediate complications. Estimated Blood Loss:     Estimated blood loss: none. Procedure:                Pre-Anesthesia Assessment:                           - Prior to the procedure, a History and Physical                            was performed, and patient medications and                            allergies were reviewed. The patient's tolerance of                            previous anesthesia was also reviewed. The risks                            and benefits of the procedure and the sedation                            options and risks were discussed with the patient.                            All questions were answered, and informed consent                            was obtained. Prior Anticoagulants: The patient has                            taken no previous anticoagulant or antiplatelet                            agents. ASA Grade Assessment: II - A patient with                            mild systemic disease. After reviewing the risks  and benefits, the patient was deemed in                            satisfactory condition to undergo the procedure.                           - Sedation was administered by an anesthesia                            professional. General anesthesia was attained.                           After obtaining informed consent, the scope was                            passed under direct  vision. Throughout the                            procedure, the patient's blood pressure, pulse, and                            oxygen saturations were monitored continuously. The                            [id number] ([id number]) scope was introduced through                            the mouth, and used to inject contrast into and                            used to inject contrast into the bile duct. The                            ERCP was accomplished without difficulty. The                            patient tolerated the procedure well. Scope In: Scope Out: Findings:      To discover objects, the biliary tree was swept with a 12 mm balloon       starting at the bifurcation. Nothing was found. One stent was removed       from the biliary tree using a snare.      The duodenoscope was advanced to the second portion of the duodenum.       Free flow of bile was noted. The stent was almost out of place.       Cannulation was easily achieved into the CBD and the guidewire was       secured in the right intrahepatic ducts. The stent was removed with a       snare. A 10-12 mm balloon was advanced to the bifurcation and it was       inflated to 12 mm. Contrast was injected and there was no evidence of a       bile leak. At this point, the catheter and the duodenoscope were removed       from the patient. Impression:               -  The biliary tree was swept and nothing was found.                           - One stent was removed from the biliary tree. Moderate Sedation:      N/A- Per Anesthesia Care Recommendation:           - Patient has a contact number available for                            emergencies. The signs and symptoms of potential                            delayed complications were discussed with the                            patient. Return to normal activities tomorrow.                            Written discharge instructions were provided to the                             patient.                           - Resume previous diet.                           - Continue present medications. Procedure Code(s):        --- Professional ---                           43275, Endoscopic retrograde    [phone]                        cholangiopancreatography (ERCP); with removal of                            foreign body(s) or stent(s) from biliary/pancreatic                            duct(s) Diagnosis Code(s):        --- Professional ---                           W11.91Z46.59, Encounter for fitting and adjustment of                            other gastrointestinal appliance and device                           K83.8, Other specified diseases of biliary tract CPT copyright 2016 American Medical Association. All rights reserved. The codes documented in this report are preliminary and upon coder review may  be revised to meet current compliance requirements. Jeani HawkingPatrick Braylon Lemmons, MD Jeani HawkingPatrick Dnya Hickle, MD 08/28/2016 10:54:52 AM This report has been signed electronically. Number of Addenda: 0

## 2016-08-28 NOTE — Anesthesia Postprocedure Evaluation (Signed)
Anesthesia Post Note  Patient: Kristin Doyle  Procedure(s) Performed: Procedure(s) (LRB): ENDOSCOPIC RETROGRADE CHOLANGIOPANCREATOGRAPHY (ERCP) (N/A)  Patient location during evaluation: PACU Anesthesia Type: General Level of consciousness: awake and alert Pain management: pain level controlled Vital Signs Assessment: post-procedure vital signs reviewed and stable Respiratory status: spontaneous breathing, nonlabored ventilation, respiratory function stable and patient connected to nasal cannula oxygen Cardiovascular status: blood pressure returned to baseline and stable Postop Assessment: no signs of nausea or vomiting Anesthetic complications: no    Last Vitals:  Vitals:   08/28/16 1120 08/28/16 1130  BP: 133/77   Pulse:  67  Resp: 14   Temp:      Last Pain:  Vitals:   08/28/16 1059  TempSrc: Oral  PainSc: 1                  Miyoko Hashimi DAVID

## 2016-08-28 NOTE — Transfer of Care (Signed)
Immediate Anesthesia Transfer of Care Note  Patient: Kristin Doyle  Procedure(s) Performed: Procedure(s): ENDOSCOPIC RETROGRADE CHOLANGIOPANCREATOGRAPHY (ERCP) (N/A)  Patient Location: Endoscopy Unit  Anesthesia Type:General  Level of Consciousness: awake and alert   Airway & Oxygen Therapy: Patient Spontanous Breathing and Patient connected to face mask oxygen  Post-op Assessment: Report given to RN and Post -op Vital signs reviewed and stable  Post vital signs: Reviewed and stable  Last Vitals:  Vitals:   08/28/16 0845  BP: (!) 116/57  Pulse: 67  Resp: 14  Temp: 36.6 C    Last Pain:  Vitals:   08/28/16 0845  TempSrc: Oral         Complications: No apparent anesthesia complications

## 2016-08-28 NOTE — Anesthesia Procedure Notes (Signed)
Procedure Name: Intubation Date/Time: 08/28/2016 10:14 AM Performed by: Leroy LibmanEARDON, Kristin Somma L Patient Re-evaluated:Patient Re-evaluated prior to inductionOxygen Delivery Method: Circle system utilized Preoxygenation: Pre-oxygenation with 100% oxygen Intubation Type: IV induction Ventilation: Mask ventilation without difficulty Laryngoscope Size: Miller and 2 Grade View: Grade I Tube type: Oral Tube size: 7.0 mm Number of attempts: 1 Airway Equipment and Method: Stylet Placement Confirmation: ETT inserted through vocal cords under direct vision,  positive ETCO2 and breath sounds checked- equal and bilateral Secured at: 22 cm Tube secured with: Tape Dental Injury: Teeth and Oropharynx as per pre-operative assessment

## 2016-08-28 NOTE — Discharge Instructions (Signed)

## 2016-08-28 NOTE — Anesthesia Preprocedure Evaluation (Signed)
Anesthesia Evaluation  Patient identified by MRN, date of birth, ID band Patient awake    Reviewed: Allergy & Precautions, NPO status , Patient's Chart, lab work & pertinent test results  Airway Mallampati: I  TM Distance: >3 FB Neck ROM: Full    Dental   Pulmonary former smoker,    Pulmonary exam normal        Cardiovascular Normal cardiovascular exam     Neuro/Psych    GI/Hepatic GERD  Medicated and Controlled,  Endo/Other    Renal/GU      Musculoskeletal   Abdominal   Peds  Hematology   Anesthesia Other Findings   Reproductive/Obstetrics                             Anesthesia Physical Anesthesia Plan  ASA: II  Anesthesia Plan: General   Post-op Pain Management:    Induction: Intravenous  Airway Management Planned: Oral ETT  Additional Equipment:   Intra-op Plan:   Post-operative Plan: Extubation in OR  Informed Consent: I have reviewed the patients History and Physical, chart, labs and discussed the procedure including the risks, benefits and alternatives for the proposed anesthesia with the patient or authorized representative who has indicated his/her understanding and acceptance.     Plan Discussed with: CRNA and Surgeon  Anesthesia Plan Comments:         Anesthesia Quick Evaluation  

## 2016-08-28 NOTE — H&P (Signed)
Kristin SievertJill E Doyle HPI: This is a 50 year old female with a biliary leak s/p ERCP with stent placement.  After the stent placement she suffered with abdominal pain and some fever.  Augmentin was provided for her and her fever resolved, but she had diarrhea and nausea with Augmentin.  The antibiotic was stopped and her diarrhea resolved.  Past Medical History:  Diagnosis Date  . GERD (gastroesophageal reflux disease)   . Hiatal hernia   . Hypothyroidism   . Sciatica    not a problem currently    Past Surgical History:  Procedure Laterality Date  . CARPAL TUNNEL RELEASE Bilateral 1999-2002  . ERCP N/A 06/14/2016   Procedure: ENDOSCOPIC RETROGRADE CHOLANGIOPANCREATOGRAPHY (ERCP);  Surgeon: Jeani HawkingPatrick Swan Zayed, MD;  [facility];  Service: Endoscopy;  Laterality: N/A;  . LAPAROSCOPIC CHOLECYSTECTOMY  05/2016  . TUBAL LIGATION  2002    Family History  Problem Relation Age of Onset  . Bladder Cancer Mother   . Heart disease Maternal Grandmother   . Heart disease Paternal Grandmother   . Heart disease Maternal Uncle     Social History:  reports that she quit smoking about 27 years ago. She has a 1.00 pack-year smoking history. She has never used smokeless tobacco. She reports that she does not drink alcohol or use drugs.  Allergies:  Allergies  Allergen Reactions  . Ciprofloxacin Swelling    Medications: Scheduled: Continuous:  No results found for this or any previous visit (from the past 24 hour(s)).   No results found.  ROS:  As stated above in the HPI otherwise negative.  Last menstrual period 08/04/2016.    PE: Gen: NAD, Alert and Oriented HEENT:  [place]/AT, EOMI Neck: Supple, no LAD Lungs: CTA Bilaterally CV: RRR without M/G/R ABM: Soft, NTND, +BS Ext: No C/C/E  Assessment/Plan: 1) S/p bile leak - Stent removal.  [name] D 08/28/2016, 8:12 AM

## 2016-08-31 ENCOUNTER — Encounter (HOSPITAL_COMMUNITY): Payer: Self-pay | Admitting: Gastroenterology

## 2016-09-01 ENCOUNTER — Encounter (HOSPITAL_COMMUNITY): Payer: Self-pay | Admitting: Gastroenterology

## 2017-01-11 IMAGING — RF DG ERCP WO/W SPHINCTEROTOMY
1 series · 5 of 5 positions shown · non-contrast
Comparison: 06/14/2016

CLINICAL DATA: Encounter for removal of biliary stent.

EXAM:
ERCP
TECHNIQUE: Multiple spot images obtained with the fluoroscopic device and
submitted for interpretation post-procedure.
FLUOROSCOPY TIME:  Fluoroscopy Time:  1 minutes and 13 seconds
Radiation Exposure Index (if provided by the fluoroscopic device):
10.22 mGy
Number of Acquired Spot Images: 5

[Series 1: run · 5 of 5 slices shown]
[im 1/5]
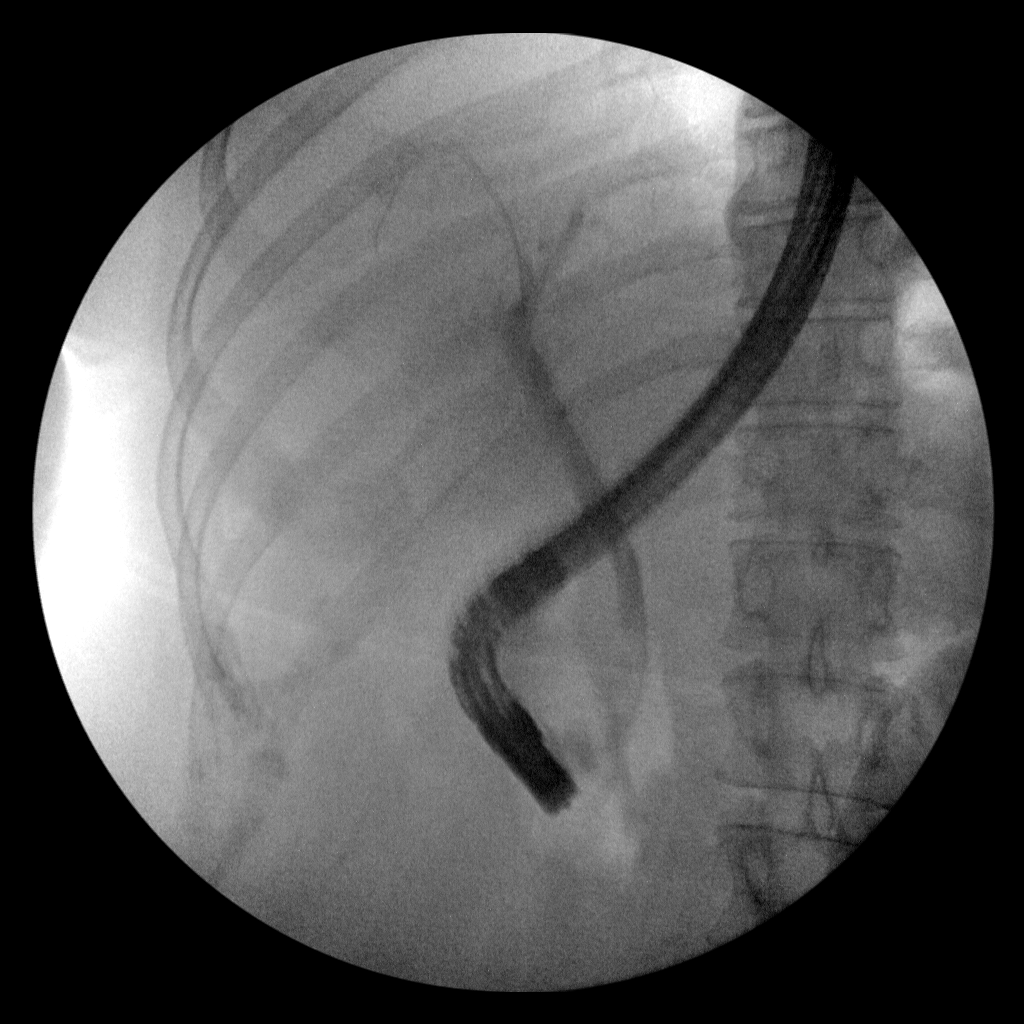
[im 2/5]
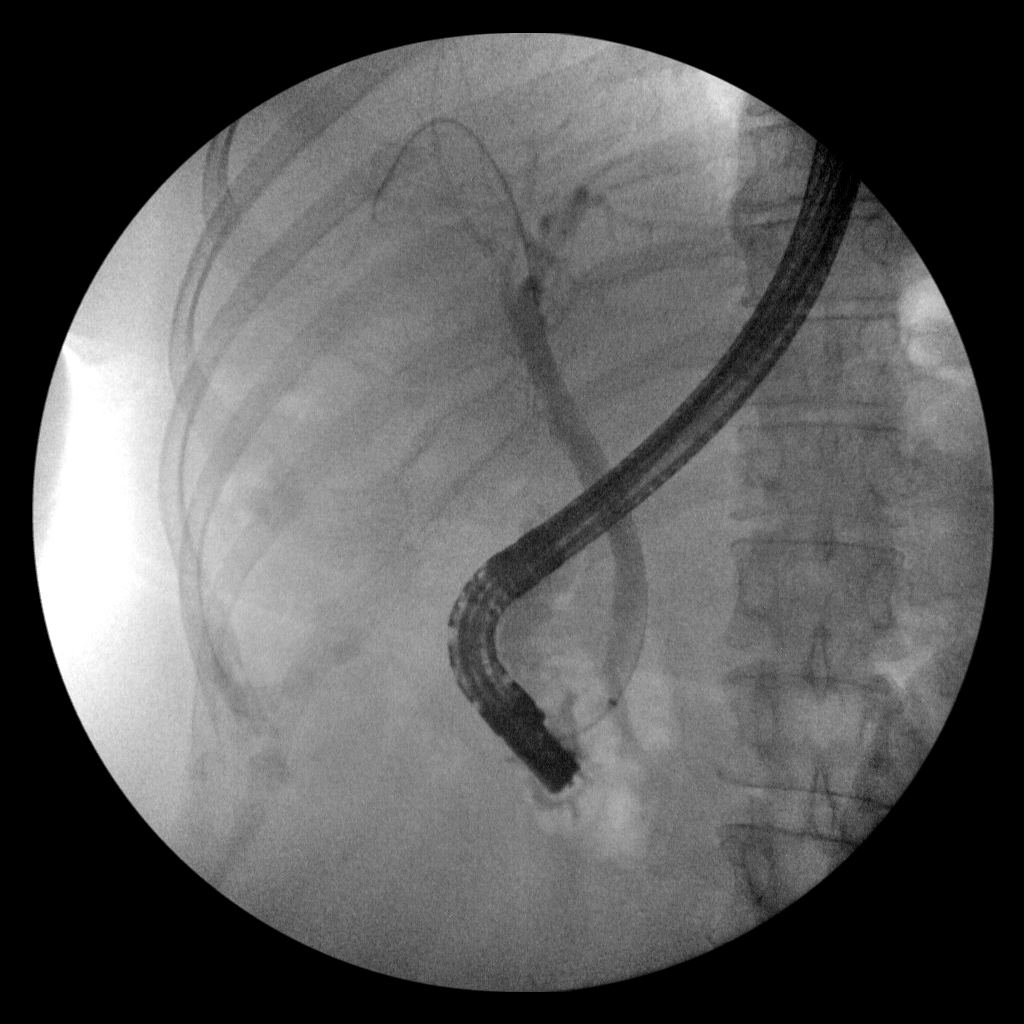
[im 3/5]
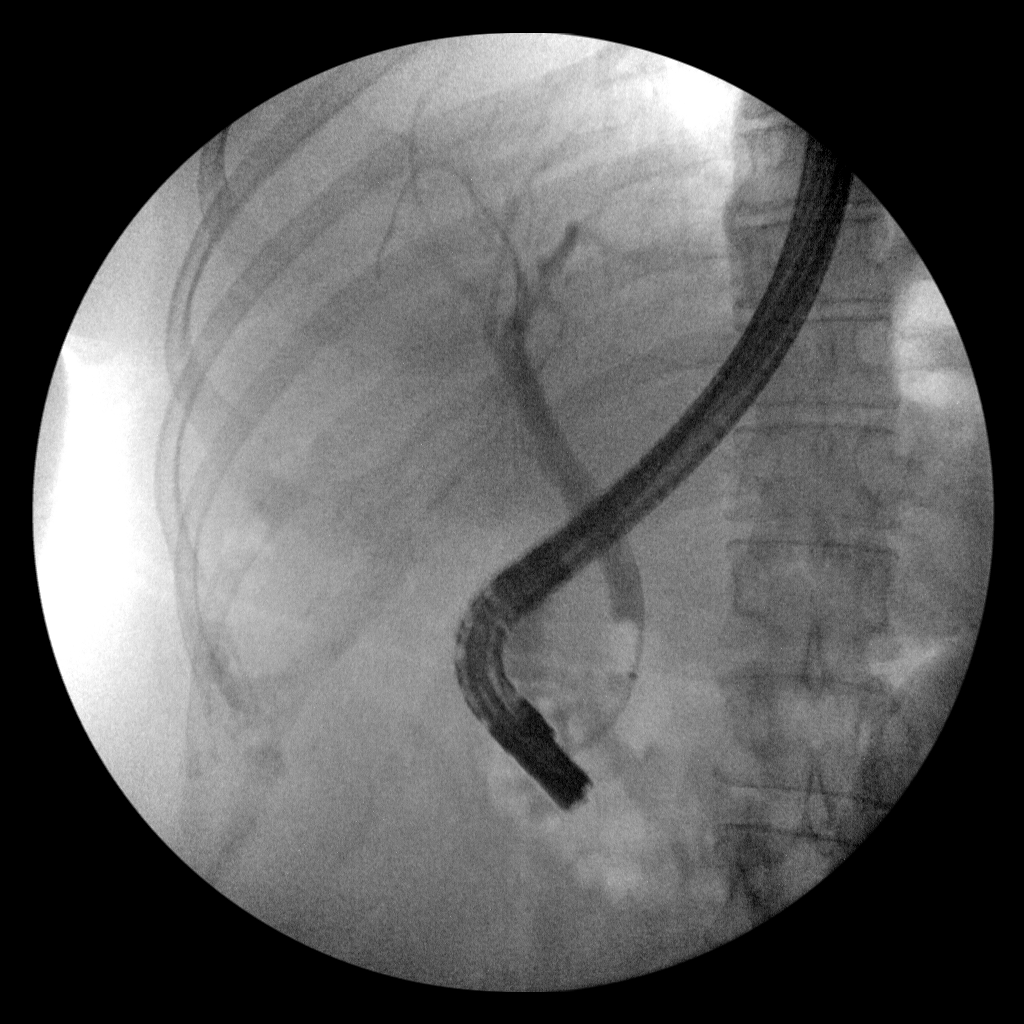
[im 4/5]
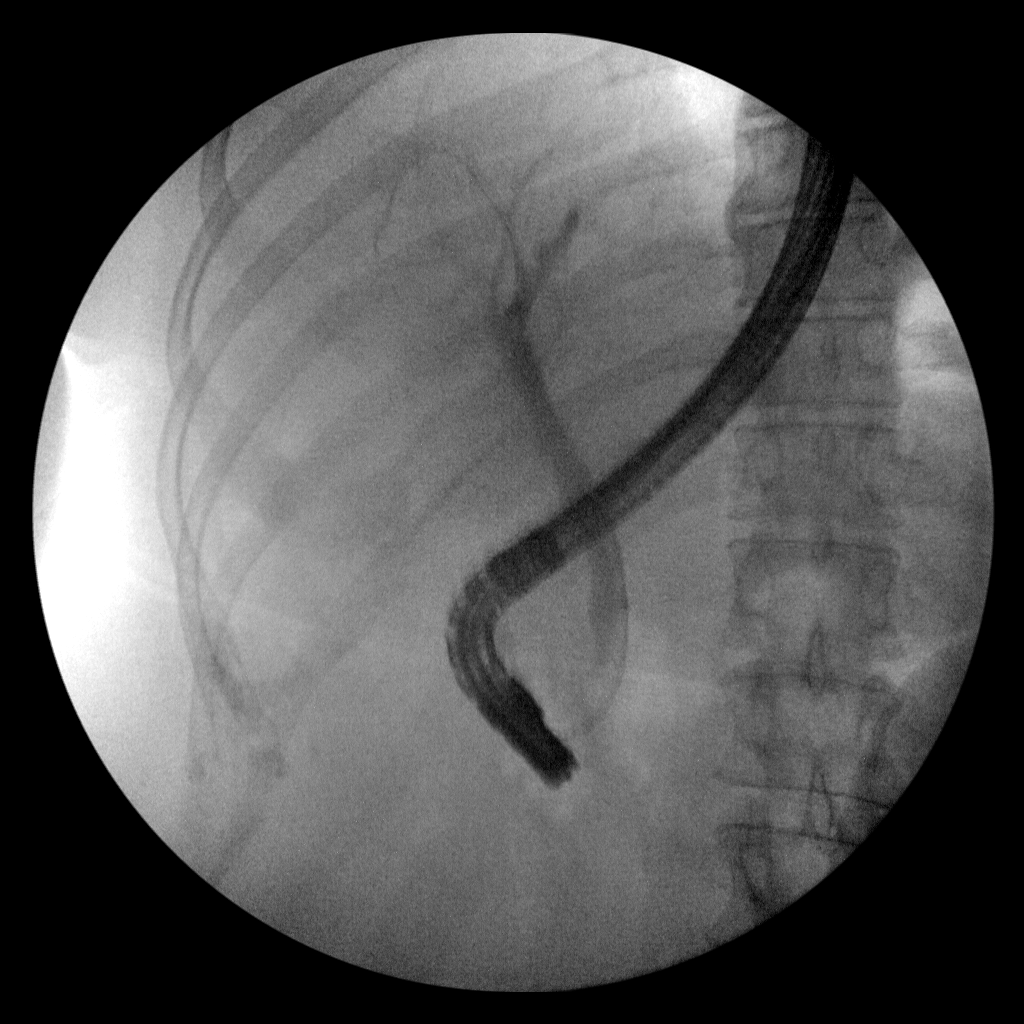
[im 5/5]
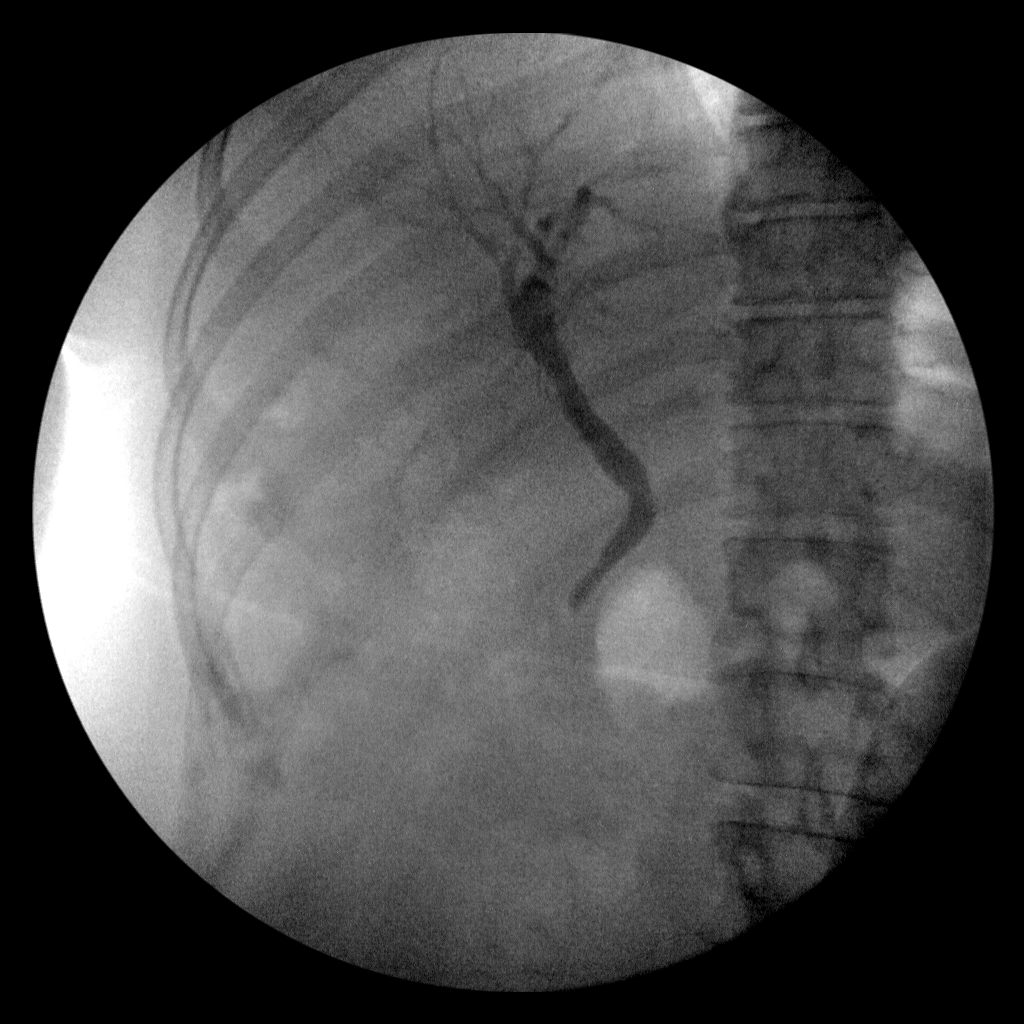

[5 of 5 positions shown; findings below may reference images not displayed]

FINDINGS: First image demonstrates opacification of the extrahepatic biliary
system and a wire was advanced into the central intrahepatic ducts.
No large filling defects identified in the common bile duct. A
balloon was placed in the distal common bile duct, possibly for a
balloon sweep. Final image demonstrates removal the scope with
residual contrast in the intrahepatic and extrahepatic biliary
system.
IMPRESSION: Opacification of the biliary system without definitive filling
defects or obstructing lesions.

These images were submitted for radiologic interpretation only.
Please see the procedural report for the amount of contrast and the
fluoroscopy time utilized.
